# Patient Record
Sex: Male | Born: 1950 | Race: White | Hispanic: No | Marital: Married | Smoking: Never smoker
Health system: Southern US, Community
[De-identification: ages and names within clinical notes are randomized; demographics above are authoritative.]

## PROBLEM LIST (undated history)

## (undated) ENCOUNTER — Emergency Department (HOSPITAL_BASED_OUTPATIENT_CLINIC_OR_DEPARTMENT_OTHER): Admission: EM | Disposition: A | Discharge status: Other Acute Inpatient Hospital | Payer: PPO

## (undated) ENCOUNTER — Emergency Department (HOSPITAL_BASED_OUTPATIENT_CLINIC_OR_DEPARTMENT_OTHER): Admission: EM | Discharge status: Against Medical Advice | Payer: PPO

## (undated) DIAGNOSIS — T8859XA Other complications of anesthesia, initial encounter: Secondary | ICD-10-CM

## (undated) DIAGNOSIS — E789 Disorder of lipoprotein metabolism, unspecified: Secondary | ICD-10-CM

## (undated) DIAGNOSIS — K219 Gastro-esophageal reflux disease without esophagitis: Secondary | ICD-10-CM

## (undated) DIAGNOSIS — I10 Essential (primary) hypertension: Secondary | ICD-10-CM

## (undated) DIAGNOSIS — R7303 Prediabetes: Secondary | ICD-10-CM

## (undated) DIAGNOSIS — N189 Chronic kidney disease, unspecified: Secondary | ICD-10-CM

## (undated) DIAGNOSIS — R519 Headache, unspecified: Secondary | ICD-10-CM

## (undated) HISTORY — PX: OTHER SURGICAL HISTORY: SHX169

## (undated) HISTORY — PX: CARPAL TUNNEL RELEASE: SHX101

## (undated) HISTORY — PX: CERVICAL SPINE SURGERY: SHX589

## (undated) HISTORY — PX: INJECTION KNEE: SHX2446

## (undated) HISTORY — PX: BACK SURGERY: SHX140

---

## 1999-02-17 ENCOUNTER — Ambulatory Visit (HOSPITAL_BASED_OUTPATIENT_CLINIC_OR_DEPARTMENT_OTHER): Admission: RE | Admit: 1999-02-17 | Discharge: 1999-02-17 | Payer: Self-pay | Admitting: Urology

## 2000-08-30 ENCOUNTER — Ambulatory Visit (HOSPITAL_COMMUNITY): Admission: RE | Admit: 2000-08-30 | Discharge: 2000-08-30 | Payer: Self-pay | Admitting: Gastroenterology

## 2000-10-28 ENCOUNTER — Encounter: Admission: RE | Admit: 2000-10-28 | Discharge: 2000-10-28 | Payer: Self-pay | Admitting: Orthopedic Surgery

## 2000-10-29 ENCOUNTER — Encounter: Payer: Self-pay | Admitting: Orthopedic Surgery

## 2000-10-29 ENCOUNTER — Encounter: Admission: RE | Admit: 2000-10-29 | Discharge: 2000-10-29 | Payer: Self-pay | Admitting: Orthopedic Surgery

## 2000-12-12 ENCOUNTER — Ambulatory Visit (HOSPITAL_COMMUNITY): Admission: RE | Admit: 2000-12-12 | Discharge: 2000-12-13 | Payer: Self-pay | Admitting: Neurological Surgery

## 2000-12-12 ENCOUNTER — Encounter: Payer: Self-pay | Admitting: Neurological Surgery

## 2001-04-11 ENCOUNTER — Encounter (INDEPENDENT_AMBULATORY_CARE_PROVIDER_SITE_OTHER): Payer: Self-pay | Admitting: Specialist

## 2001-04-11 ENCOUNTER — Other Ambulatory Visit: Admission: RE | Admit: 2001-04-11 | Discharge: 2001-04-11 | Payer: Self-pay | Admitting: Otolaryngology

## 2005-07-05 ENCOUNTER — Encounter (INDEPENDENT_AMBULATORY_CARE_PROVIDER_SITE_OTHER): Payer: Self-pay | Admitting: *Deleted

## 2005-07-05 ENCOUNTER — Ambulatory Visit (HOSPITAL_BASED_OUTPATIENT_CLINIC_OR_DEPARTMENT_OTHER): Admission: RE | Admit: 2005-07-05 | Discharge: 2005-07-05 | Payer: Self-pay | Admitting: Plastic Surgery

## 2007-06-10 ENCOUNTER — Emergency Department (HOSPITAL_COMMUNITY): Admission: EM | Admit: 2007-06-10 | Discharge: 2007-06-10 | Payer: Self-pay | Admitting: Emergency Medicine

## 2008-11-08 ENCOUNTER — Encounter: Admission: RE | Admit: 2008-11-08 | Discharge: 2008-11-08 | Payer: Self-pay | Admitting: Internal Medicine

## 2009-09-13 ENCOUNTER — Encounter: Admission: RE | Admit: 2009-09-13 | Discharge: 2009-09-13 | Payer: Self-pay | Admitting: Gastroenterology

## 2010-11-10 ENCOUNTER — Encounter
Admission: RE | Admit: 2010-11-10 | Discharge: 2010-11-10 | Payer: Self-pay | Source: Home / Self Care | Attending: Chiropractic Medicine | Admitting: Chiropractic Medicine

## 2011-04-06 NOTE — Op Note (Signed)
[place]  Patient:    Blake Cox, Blake Cox                        MRN: [id number] Proc. Date: 12/12/00 Adm. Date:  [id number] Disc. 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  PROCEDURE:  Right cervical microendoscopic diskectomy with Met-RX system, microscope and microdissection technique.  SURGEON:  Stefani Dama, M.D.  FIRST ASSISTANT:  Cristi Loron, M.D.  ANESTHESIA:  General endotracheal.  INDICATIONS:  The patient is a 60 year old individual who has had weakness and pain in the right shoulder and arm.  He has weakness in the triceps, and an MRI demonstrates a herniated nucleus pulposus at C6-7 in the foramen.  DESCRIPTION OF PROCEDURE:  The patient was brought to the operating room supine on the stretcher.  After smooth induction of general endotracheal anesthesia, a central catheter was placed, and the patient was then placed into the three-pin head rest and into the sitting position.  The back of the neck was shaved, prepped with Duraprep, and draped in a sterile fashion, and then using fluoroscopic localization, a K-wire was passed to the C6-7 interspace.  A 1.5 cm incision was made on either side of the K-wire in a vertical plane paramedian on the right side, and then a series of dilators was placed over the K-wire down to the interspace at C6-7 on the right edge of the facet.  The cannulas were sequentially placed at 18 mm diameter, and an 18 mm x 6 cm long cannula was affixed to the bed frame.  A microscope was brought into the field.  Using monopolar cautery, some of the extraneous tissue around the facet joint was removed.  Then an Anspach and a 2.3 mm drilling bit was used  to remove the inferior margin of the lamina of C6 out to the medial wall of the facet and the superior margin of the lamina of C7.  A series of punches and rongeurs then with microdissection technique was used to remove bits of tissue and expose the common dural tube and the takeoff of the C7 nerve root. Epidural veins above the C7 root were dissected using a microdissection technique and cauterized and then divided.  The nerve root was mobilized from superior to inferior.  There was noted to be a fullness in this region.  Then under the inferior aspect of the nerve, the nerve root was mobilized superiorly and a portion of the ligament was noted to be bowed.  This was incised.  A small fragment of disk was retained from this area.  Further dissection in this area revealed no other fragments of disk.  The nerve root was then checked for its freedom and its clearance at the foramen on the right side at C7.  A good bit of dissection was done during this time, as it was felt that there should be other fragments of disk.  Then by dissecting both cephalad to the nerve and inferiorly to the nerve, it was felt that all the fragments of disk that were available were removed.  The nerve root was well decompressed.  The interspace was controlled hemostatically, and then the microscope was removed from the field and the endoscope was removed, and the subcutaneous tissues were cauterized and then 3-0 Vicryl was used subcutaneously and subcuticularly to close the skin and the fascia.  The patient tolerated the procedure well, was returned to the recovery room in stable condition. DD:  12/12/00 TD:  12/12/00 Job: [id number]

## 2011-04-06 NOTE — Op Note (Signed)
NAMEDURAN, OHERN NO.:  [id number]   MEDICAL RECORD NO.:  [id number]          PATIENT TYPE:  AMB   LOCATION:  DSC                          FACILITY:  [facility]   PHYSICIAN:  Alfredia Ferguson, M.D.  DATE OF BIRTH:  [birth year]   DATE OF PROCEDURE:  07/05/2005  DATE OF DISCHARGE:                                 OPERATIVE REPORT   PREOPERATIVE DIAGNOSIS:  A 1 cm sebaceous cyst, right temple.   POSTOPERATIVE DIAGNOSIS:  A 1 cm sebaceous cyst, right temple.   OPERATION PERFORMED:  Excision sebaceous cyst, right temple.   SURGEON:  Alfredia Ferguson, M.D.   ANESTHESIA:  2% Xylocaine 1:100,000 epinephrine.   INDICATIONS FOR PROCEDURE:  The patient is a 60 year old gentleman with at  least a three-year history of slowly enlarging cyst, right temple area.  He  wishes to have it removed.  He understands the risk of recurrence and  unsightly scarring.  In spite of that, he wishes to proceed.   DESCRIPTION OF PROCEDURE:  An elliptical skin marker was placed over the  cyst which included the diseased punctum.  Local anesthesia was infiltrated  and the area was prepped with Betadine and draped with sterile drapes.  After waiting approximately seven minutes, an elliptical skin incision was  made and the cyst was identified just in the subcutaneous space.  Using a  combination of blunt and sharp dissection, the cyst was dissected out of its  anatomic position.  The cyst was submitted for pathology.  Hemostasis was  accomplished using pressure.  The wound was closed with interrupted 5-0  Monocryl for the dermis followed by a running 6-0 nylon suture for the skin  edges.  The patient tolerated the procedure well.  He had a very short-lived  vasovagal reaction with some sweatiness and feeling of nausea which passed  within a few minutes.  The patient was discharged in good condition.      Alfredia Ferguson, M.D.  Electronically Signed     WBB/MEDQ  D:  07/05/2005  T:   07/05/2005  Job:  [id number]

## 2011-07-04 ENCOUNTER — Other Ambulatory Visit: Payer: Self-pay | Admitting: Gastroenterology

## 2011-07-06 ENCOUNTER — Ambulatory Visit
Admission: RE | Admit: 2011-07-06 | Discharge: 2011-07-06 | Disposition: A | Discharge status: Home / Self Care | Payer: Self-pay | Source: Ambulatory Visit | Attending: Gastroenterology | Admitting: Gastroenterology

## 2016-05-07 DIAGNOSIS — H5201 Hypermetropia, right eye: Secondary | ICD-10-CM | POA: Diagnosis not present

## 2016-05-07 DIAGNOSIS — H25093 Other age-related incipient cataract, bilateral: Secondary | ICD-10-CM | POA: Diagnosis not present

## 2016-05-07 DIAGNOSIS — H524 Presbyopia: Secondary | ICD-10-CM | POA: Diagnosis not present

## 2016-05-07 DIAGNOSIS — H52223 Regular astigmatism, bilateral: Secondary | ICD-10-CM | POA: Diagnosis not present

## 2016-06-25 DIAGNOSIS — L570 Actinic keratosis: Secondary | ICD-10-CM | POA: Diagnosis not present

## 2016-06-25 DIAGNOSIS — D239 Other benign neoplasm of skin, unspecified: Secondary | ICD-10-CM | POA: Diagnosis not present

## 2016-06-27 DIAGNOSIS — R05 Cough: Secondary | ICD-10-CM | POA: Diagnosis not present

## 2016-06-27 DIAGNOSIS — J22 Unspecified acute lower respiratory infection: Secondary | ICD-10-CM | POA: Diagnosis not present

## 2016-07-17 DIAGNOSIS — K210 Gastro-esophageal reflux disease with esophagitis: Secondary | ICD-10-CM | POA: Diagnosis not present

## 2016-07-17 DIAGNOSIS — Z0000 Encounter for general adult medical examination without abnormal findings: Secondary | ICD-10-CM | POA: Diagnosis not present

## 2016-07-17 DIAGNOSIS — E782 Mixed hyperlipidemia: Secondary | ICD-10-CM | POA: Diagnosis not present

## 2016-07-17 DIAGNOSIS — I10 Essential (primary) hypertension: Secondary | ICD-10-CM | POA: Diagnosis not present

## 2016-07-17 DIAGNOSIS — R05 Cough: Secondary | ICD-10-CM | POA: Diagnosis not present

## 2016-07-24 DIAGNOSIS — E782 Mixed hyperlipidemia: Secondary | ICD-10-CM | POA: Diagnosis not present

## 2016-07-24 DIAGNOSIS — K210 Gastro-esophageal reflux disease with esophagitis: Secondary | ICD-10-CM | POA: Diagnosis not present

## 2016-07-24 DIAGNOSIS — M150 Primary generalized (osteo)arthritis: Secondary | ICD-10-CM | POA: Diagnosis not present

## 2016-07-24 DIAGNOSIS — R7303 Prediabetes: Secondary | ICD-10-CM | POA: Diagnosis not present

## 2016-11-26 DIAGNOSIS — M19071 Primary osteoarthritis, right ankle and foot: Secondary | ICD-10-CM | POA: Diagnosis not present

## 2016-11-26 DIAGNOSIS — M79671 Pain in right foot: Secondary | ICD-10-CM | POA: Diagnosis not present

## 2016-12-19 DIAGNOSIS — G8929 Other chronic pain: Secondary | ICD-10-CM | POA: Diagnosis not present

## 2016-12-19 DIAGNOSIS — M71571 Other bursitis, not elsewhere classified, right ankle and foot: Secondary | ICD-10-CM | POA: Diagnosis not present

## 2016-12-19 DIAGNOSIS — M79671 Pain in right foot: Secondary | ICD-10-CM | POA: Diagnosis not present

## 2017-02-12 DIAGNOSIS — Z0000 Encounter for general adult medical examination without abnormal findings: Secondary | ICD-10-CM | POA: Diagnosis not present

## 2017-02-27 DIAGNOSIS — I10 Essential (primary) hypertension: Secondary | ICD-10-CM | POA: Diagnosis not present

## 2017-02-27 DIAGNOSIS — E782 Mixed hyperlipidemia: Secondary | ICD-10-CM | POA: Diagnosis not present

## 2017-03-25 DIAGNOSIS — K602 Anal fissure, unspecified: Secondary | ICD-10-CM | POA: Diagnosis not present

## 2017-03-25 DIAGNOSIS — K219 Gastro-esophageal reflux disease without esophagitis: Secondary | ICD-10-CM | POA: Diagnosis not present

## 2017-04-10 DIAGNOSIS — L570 Actinic keratosis: Secondary | ICD-10-CM | POA: Diagnosis not present

## 2017-05-06 DIAGNOSIS — E782 Mixed hyperlipidemia: Secondary | ICD-10-CM | POA: Diagnosis not present

## 2017-05-06 DIAGNOSIS — I10 Essential (primary) hypertension: Secondary | ICD-10-CM | POA: Diagnosis not present

## 2017-05-13 DIAGNOSIS — H25093 Other age-related incipient cataract, bilateral: Secondary | ICD-10-CM | POA: Diagnosis not present

## 2017-06-04 DIAGNOSIS — I10 Essential (primary) hypertension: Secondary | ICD-10-CM | POA: Diagnosis not present

## 2017-06-04 DIAGNOSIS — L237 Allergic contact dermatitis due to plants, except food: Secondary | ICD-10-CM | POA: Diagnosis not present

## 2017-06-04 DIAGNOSIS — E782 Mixed hyperlipidemia: Secondary | ICD-10-CM | POA: Diagnosis not present

## 2017-07-03 DIAGNOSIS — H903 Sensorineural hearing loss, bilateral: Secondary | ICD-10-CM | POA: Diagnosis not present

## 2017-09-09 DIAGNOSIS — M7022 Olecranon bursitis, left elbow: Secondary | ICD-10-CM | POA: Diagnosis not present

## 2017-09-30 DIAGNOSIS — Z125 Encounter for screening for malignant neoplasm of prostate: Secondary | ICD-10-CM | POA: Diagnosis not present

## 2017-09-30 DIAGNOSIS — M150 Primary generalized (osteo)arthritis: Secondary | ICD-10-CM | POA: Diagnosis not present

## 2017-09-30 DIAGNOSIS — E782 Mixed hyperlipidemia: Secondary | ICD-10-CM | POA: Diagnosis not present

## 2017-10-07 DIAGNOSIS — E782 Mixed hyperlipidemia: Secondary | ICD-10-CM | POA: Diagnosis not present

## 2017-10-07 DIAGNOSIS — Z23 Encounter for immunization: Secondary | ICD-10-CM | POA: Diagnosis not present

## 2017-10-07 DIAGNOSIS — N183 Chronic kidney disease, stage 3 (moderate): Secondary | ICD-10-CM | POA: Diagnosis not present

## 2017-10-07 DIAGNOSIS — I10 Essential (primary) hypertension: Secondary | ICD-10-CM | POA: Diagnosis not present

## 2017-10-07 DIAGNOSIS — M150 Primary generalized (osteo)arthritis: Secondary | ICD-10-CM | POA: Diagnosis not present

## 2017-10-07 DIAGNOSIS — Z0000 Encounter for general adult medical examination without abnormal findings: Secondary | ICD-10-CM | POA: Diagnosis not present

## 2018-01-17 DIAGNOSIS — M5442 Lumbago with sciatica, left side: Secondary | ICD-10-CM | POA: Diagnosis not present

## 2018-01-17 DIAGNOSIS — M5441 Lumbago with sciatica, right side: Secondary | ICD-10-CM | POA: Diagnosis not present

## 2018-01-17 DIAGNOSIS — M62830 Muscle spasm of back: Secondary | ICD-10-CM | POA: Diagnosis not present

## 2018-01-17 DIAGNOSIS — M47816 Spondylosis without myelopathy or radiculopathy, lumbar region: Secondary | ICD-10-CM | POA: Diagnosis not present

## 2018-04-23 DIAGNOSIS — H524 Presbyopia: Secondary | ICD-10-CM | POA: Diagnosis not present

## 2018-04-23 DIAGNOSIS — H5201 Hypermetropia, right eye: Secondary | ICD-10-CM | POA: Diagnosis not present

## 2018-04-23 DIAGNOSIS — H25093 Other age-related incipient cataract, bilateral: Secondary | ICD-10-CM | POA: Diagnosis not present

## 2018-04-23 DIAGNOSIS — H52223 Regular astigmatism, bilateral: Secondary | ICD-10-CM | POA: Diagnosis not present

## 2018-07-14 DIAGNOSIS — K219 Gastro-esophageal reflux disease without esophagitis: Secondary | ICD-10-CM | POA: Diagnosis not present

## 2018-10-03 DIAGNOSIS — M5442 Lumbago with sciatica, left side: Secondary | ICD-10-CM | POA: Diagnosis not present

## 2018-10-03 DIAGNOSIS — M5441 Lumbago with sciatica, right side: Secondary | ICD-10-CM | POA: Diagnosis not present

## 2018-10-03 DIAGNOSIS — G8929 Other chronic pain: Secondary | ICD-10-CM | POA: Diagnosis not present

## 2018-10-03 DIAGNOSIS — M47816 Spondylosis without myelopathy or radiculopathy, lumbar region: Secondary | ICD-10-CM | POA: Diagnosis not present

## 2018-10-07 DIAGNOSIS — M79604 Pain in right leg: Secondary | ICD-10-CM | POA: Diagnosis not present

## 2018-10-07 DIAGNOSIS — M79605 Pain in left leg: Secondary | ICD-10-CM | POA: Diagnosis not present

## 2018-10-07 DIAGNOSIS — R262 Difficulty in walking, not elsewhere classified: Secondary | ICD-10-CM | POA: Diagnosis not present

## 2018-10-07 DIAGNOSIS — M545 Low back pain: Secondary | ICD-10-CM | POA: Diagnosis not present

## 2018-10-08 DIAGNOSIS — E782 Mixed hyperlipidemia: Secondary | ICD-10-CM | POA: Diagnosis not present

## 2018-10-08 DIAGNOSIS — I10 Essential (primary) hypertension: Secondary | ICD-10-CM | POA: Diagnosis not present

## 2018-10-08 DIAGNOSIS — Z0000 Encounter for general adult medical examination without abnormal findings: Secondary | ICD-10-CM | POA: Diagnosis not present

## 2018-10-08 DIAGNOSIS — N183 Chronic kidney disease, stage 3 (moderate): Secondary | ICD-10-CM | POA: Diagnosis not present

## 2018-10-13 DIAGNOSIS — R262 Difficulty in walking, not elsewhere classified: Secondary | ICD-10-CM | POA: Diagnosis not present

## 2018-10-13 DIAGNOSIS — M545 Low back pain: Secondary | ICD-10-CM | POA: Diagnosis not present

## 2018-10-13 DIAGNOSIS — M79604 Pain in right leg: Secondary | ICD-10-CM | POA: Diagnosis not present

## 2018-10-13 DIAGNOSIS — M79605 Pain in left leg: Secondary | ICD-10-CM | POA: Diagnosis not present

## 2018-10-15 DIAGNOSIS — R262 Difficulty in walking, not elsewhere classified: Secondary | ICD-10-CM | POA: Diagnosis not present

## 2018-10-15 DIAGNOSIS — Z0000 Encounter for general adult medical examination without abnormal findings: Secondary | ICD-10-CM | POA: Diagnosis not present

## 2018-10-15 DIAGNOSIS — M79604 Pain in right leg: Secondary | ICD-10-CM | POA: Diagnosis not present

## 2018-10-15 DIAGNOSIS — I10 Essential (primary) hypertension: Secondary | ICD-10-CM | POA: Diagnosis not present

## 2018-10-15 DIAGNOSIS — M79605 Pain in left leg: Secondary | ICD-10-CM | POA: Diagnosis not present

## 2018-10-15 DIAGNOSIS — R002 Palpitations: Secondary | ICD-10-CM | POA: Diagnosis not present

## 2018-10-15 DIAGNOSIS — E782 Mixed hyperlipidemia: Secondary | ICD-10-CM | POA: Diagnosis not present

## 2018-10-15 DIAGNOSIS — N183 Chronic kidney disease, stage 3 (moderate): Secondary | ICD-10-CM | POA: Diagnosis not present

## 2018-10-15 DIAGNOSIS — M545 Low back pain: Secondary | ICD-10-CM | POA: Diagnosis not present

## 2018-10-15 DIAGNOSIS — D751 Secondary polycythemia: Secondary | ICD-10-CM | POA: Diagnosis not present

## 2018-10-21 DIAGNOSIS — M79604 Pain in right leg: Secondary | ICD-10-CM | POA: Diagnosis not present

## 2018-10-21 DIAGNOSIS — M79605 Pain in left leg: Secondary | ICD-10-CM | POA: Diagnosis not present

## 2018-10-21 DIAGNOSIS — M545 Low back pain: Secondary | ICD-10-CM | POA: Diagnosis not present

## 2018-10-21 DIAGNOSIS — R262 Difficulty in walking, not elsewhere classified: Secondary | ICD-10-CM | POA: Diagnosis not present

## 2018-10-23 DIAGNOSIS — M545 Low back pain: Secondary | ICD-10-CM | POA: Diagnosis not present

## 2018-10-23 DIAGNOSIS — R262 Difficulty in walking, not elsewhere classified: Secondary | ICD-10-CM | POA: Diagnosis not present

## 2018-10-23 DIAGNOSIS — M79604 Pain in right leg: Secondary | ICD-10-CM | POA: Diagnosis not present

## 2018-10-23 DIAGNOSIS — M79605 Pain in left leg: Secondary | ICD-10-CM | POA: Diagnosis not present

## 2018-10-29 DIAGNOSIS — R262 Difficulty in walking, not elsewhere classified: Secondary | ICD-10-CM | POA: Diagnosis not present

## 2018-10-29 DIAGNOSIS — M79605 Pain in left leg: Secondary | ICD-10-CM | POA: Diagnosis not present

## 2018-10-29 DIAGNOSIS — M545 Low back pain: Secondary | ICD-10-CM | POA: Diagnosis not present

## 2018-10-29 DIAGNOSIS — M79604 Pain in right leg: Secondary | ICD-10-CM | POA: Diagnosis not present

## 2018-10-31 DIAGNOSIS — R262 Difficulty in walking, not elsewhere classified: Secondary | ICD-10-CM | POA: Diagnosis not present

## 2018-10-31 DIAGNOSIS — M79604 Pain in right leg: Secondary | ICD-10-CM | POA: Diagnosis not present

## 2018-10-31 DIAGNOSIS — M545 Low back pain: Secondary | ICD-10-CM | POA: Diagnosis not present

## 2018-10-31 DIAGNOSIS — M79605 Pain in left leg: Secondary | ICD-10-CM | POA: Diagnosis not present

## 2018-11-05 DIAGNOSIS — R262 Difficulty in walking, not elsewhere classified: Secondary | ICD-10-CM | POA: Diagnosis not present

## 2018-11-05 DIAGNOSIS — M545 Low back pain: Secondary | ICD-10-CM | POA: Diagnosis not present

## 2018-11-05 DIAGNOSIS — M79605 Pain in left leg: Secondary | ICD-10-CM | POA: Diagnosis not present

## 2018-11-05 DIAGNOSIS — M79604 Pain in right leg: Secondary | ICD-10-CM | POA: Diagnosis not present

## 2018-11-13 DIAGNOSIS — M545 Low back pain: Secondary | ICD-10-CM | POA: Diagnosis not present

## 2018-11-13 DIAGNOSIS — M79604 Pain in right leg: Secondary | ICD-10-CM | POA: Diagnosis not present

## 2018-11-13 DIAGNOSIS — R262 Difficulty in walking, not elsewhere classified: Secondary | ICD-10-CM | POA: Diagnosis not present

## 2018-11-13 DIAGNOSIS — M79605 Pain in left leg: Secondary | ICD-10-CM | POA: Diagnosis not present

## 2018-11-14 DIAGNOSIS — G8929 Other chronic pain: Secondary | ICD-10-CM | POA: Diagnosis not present

## 2018-11-14 DIAGNOSIS — M5441 Lumbago with sciatica, right side: Secondary | ICD-10-CM | POA: Diagnosis not present

## 2018-11-14 DIAGNOSIS — M5442 Lumbago with sciatica, left side: Secondary | ICD-10-CM | POA: Diagnosis not present

## 2018-11-18 DIAGNOSIS — L570 Actinic keratosis: Secondary | ICD-10-CM | POA: Diagnosis not present

## 2018-11-18 DIAGNOSIS — D229 Melanocytic nevi, unspecified: Secondary | ICD-10-CM | POA: Diagnosis not present

## 2018-11-18 DIAGNOSIS — L2081 Atopic neurodermatitis: Secondary | ICD-10-CM | POA: Diagnosis not present

## 2018-11-18 DIAGNOSIS — L821 Other seborrheic keratosis: Secondary | ICD-10-CM | POA: Diagnosis not present

## 2018-11-27 ENCOUNTER — Other Ambulatory Visit: Payer: Self-pay | Admitting: Internal Medicine

## 2018-11-27 DIAGNOSIS — M5442 Lumbago with sciatica, left side: Secondary | ICD-10-CM

## 2018-11-27 DIAGNOSIS — M5441 Lumbago with sciatica, right side: Secondary | ICD-10-CM

## 2018-12-06 ENCOUNTER — Ambulatory Visit
Admission: RE | Admit: 2018-12-06 | Discharge: 2018-12-06 | Disposition: A | Discharge status: Home / Self Care | Payer: PPO | Source: Ambulatory Visit | Attending: Internal Medicine | Admitting: Internal Medicine

## 2018-12-06 DIAGNOSIS — M48061 Spinal stenosis, lumbar region without neurogenic claudication: Secondary | ICD-10-CM | POA: Diagnosis not present

## 2018-12-06 DIAGNOSIS — M5442 Lumbago with sciatica, left side: Secondary | ICD-10-CM

## 2018-12-06 DIAGNOSIS — M5441 Lumbago with sciatica, right side: Secondary | ICD-10-CM

## 2018-12-31 DIAGNOSIS — M4150 Other secondary scoliosis, site unspecified: Secondary | ICD-10-CM | POA: Diagnosis not present

## 2018-12-31 DIAGNOSIS — Z6828 Body mass index (BMI) 28.0-28.9, adult: Secondary | ICD-10-CM | POA: Diagnosis not present

## 2018-12-31 DIAGNOSIS — M48061 Spinal stenosis, lumbar region without neurogenic claudication: Secondary | ICD-10-CM | POA: Diagnosis not present

## 2018-12-31 DIAGNOSIS — I10 Essential (primary) hypertension: Secondary | ICD-10-CM | POA: Diagnosis not present

## 2019-01-27 DIAGNOSIS — M5116 Intervertebral disc disorders with radiculopathy, lumbar region: Secondary | ICD-10-CM | POA: Diagnosis not present

## 2019-01-27 DIAGNOSIS — M5416 Radiculopathy, lumbar region: Secondary | ICD-10-CM | POA: Diagnosis not present

## 2019-05-04 DIAGNOSIS — M5416 Radiculopathy, lumbar region: Secondary | ICD-10-CM | POA: Diagnosis not present

## 2019-05-04 DIAGNOSIS — M5116 Intervertebral disc disorders with radiculopathy, lumbar region: Secondary | ICD-10-CM | POA: Diagnosis not present

## 2019-05-08 ENCOUNTER — Other Ambulatory Visit (HOSPITAL_COMMUNITY): Payer: Self-pay | Admitting: Internal Medicine

## 2019-05-08 DIAGNOSIS — R7303 Prediabetes: Secondary | ICD-10-CM | POA: Diagnosis not present

## 2019-05-08 DIAGNOSIS — R29810 Facial weakness: Secondary | ICD-10-CM | POA: Diagnosis not present

## 2019-05-08 DIAGNOSIS — R531 Weakness: Secondary | ICD-10-CM | POA: Diagnosis not present

## 2019-05-08 DIAGNOSIS — E782 Mixed hyperlipidemia: Secondary | ICD-10-CM | POA: Diagnosis not present

## 2019-05-08 DIAGNOSIS — R2990 Unspecified symptoms and signs involving the nervous system: Secondary | ICD-10-CM

## 2019-05-08 DIAGNOSIS — N183 Chronic kidney disease, stage 3 (moderate): Secondary | ICD-10-CM | POA: Diagnosis not present

## 2019-05-08 DIAGNOSIS — R278 Other lack of coordination: Secondary | ICD-10-CM | POA: Diagnosis not present

## 2019-05-08 DIAGNOSIS — I10 Essential (primary) hypertension: Secondary | ICD-10-CM | POA: Diagnosis not present

## 2019-05-12 ENCOUNTER — Telehealth (HOSPITAL_COMMUNITY): Payer: Self-pay | Admitting: Radiology

## 2019-05-12 NOTE — Telephone Encounter (Signed)

## 2019-05-13 ENCOUNTER — Ambulatory Visit (HOSPITAL_COMMUNITY): Payer: PPO | Attending: Cardiology

## 2019-05-13 ENCOUNTER — Encounter (INDEPENDENT_AMBULATORY_CARE_PROVIDER_SITE_OTHER): Payer: Self-pay

## 2019-05-13 ENCOUNTER — Other Ambulatory Visit: Payer: Self-pay

## 2019-05-13 DIAGNOSIS — I77810 Thoracic aortic ectasia: Secondary | ICD-10-CM | POA: Insufficient documentation

## 2019-05-13 DIAGNOSIS — I358 Other nonrheumatic aortic valve disorders: Secondary | ICD-10-CM | POA: Diagnosis not present

## 2019-05-13 DIAGNOSIS — R2990 Unspecified symptoms and signs involving the nervous system: Secondary | ICD-10-CM

## 2019-05-21 DIAGNOSIS — N183 Chronic kidney disease, stage 3 (moderate): Secondary | ICD-10-CM | POA: Diagnosis not present

## 2019-05-21 DIAGNOSIS — D751 Secondary polycythemia: Secondary | ICD-10-CM | POA: Diagnosis not present

## 2019-05-21 DIAGNOSIS — E782 Mixed hyperlipidemia: Secondary | ICD-10-CM | POA: Diagnosis not present

## 2019-05-29 DIAGNOSIS — R278 Other lack of coordination: Secondary | ICD-10-CM | POA: Diagnosis not present

## 2019-05-29 DIAGNOSIS — Z7189 Other specified counseling: Secondary | ICD-10-CM | POA: Diagnosis not present

## 2019-05-29 DIAGNOSIS — E782 Mixed hyperlipidemia: Secondary | ICD-10-CM | POA: Diagnosis not present

## 2019-05-29 DIAGNOSIS — R42 Dizziness and giddiness: Secondary | ICD-10-CM | POA: Diagnosis not present

## 2019-05-29 DIAGNOSIS — I10 Essential (primary) hypertension: Secondary | ICD-10-CM | POA: Diagnosis not present

## 2019-06-03 DIAGNOSIS — R42 Dizziness and giddiness: Secondary | ICD-10-CM | POA: Diagnosis not present

## 2019-06-19 ENCOUNTER — Other Ambulatory Visit: Payer: Self-pay

## 2019-06-19 DIAGNOSIS — S0341XA Sprain of jaw, right side, initial encounter: Secondary | ICD-10-CM | POA: Diagnosis not present

## 2019-07-07 DIAGNOSIS — H52223 Regular astigmatism, bilateral: Secondary | ICD-10-CM | POA: Diagnosis not present

## 2019-07-07 DIAGNOSIS — H5203 Hypermetropia, bilateral: Secondary | ICD-10-CM | POA: Diagnosis not present

## 2019-07-07 DIAGNOSIS — H25093 Other age-related incipient cataract, bilateral: Secondary | ICD-10-CM | POA: Diagnosis not present

## 2019-07-07 DIAGNOSIS — H524 Presbyopia: Secondary | ICD-10-CM | POA: Diagnosis not present

## 2019-08-06 DIAGNOSIS — M5116 Intervertebral disc disorders with radiculopathy, lumbar region: Secondary | ICD-10-CM | POA: Diagnosis not present

## 2019-08-06 DIAGNOSIS — M5416 Radiculopathy, lumbar region: Secondary | ICD-10-CM | POA: Diagnosis not present

## 2019-08-17 DIAGNOSIS — K219 Gastro-esophageal reflux disease without esophagitis: Secondary | ICD-10-CM | POA: Diagnosis not present

## 2019-09-07 DIAGNOSIS — E782 Mixed hyperlipidemia: Secondary | ICD-10-CM | POA: Diagnosis not present

## 2019-09-11 DIAGNOSIS — Z7189 Other specified counseling: Secondary | ICD-10-CM | POA: Diagnosis not present

## 2019-09-11 DIAGNOSIS — E782 Mixed hyperlipidemia: Secondary | ICD-10-CM | POA: Diagnosis not present

## 2019-09-11 DIAGNOSIS — I10 Essential (primary) hypertension: Secondary | ICD-10-CM | POA: Diagnosis not present

## 2019-09-11 DIAGNOSIS — Z23 Encounter for immunization: Secondary | ICD-10-CM | POA: Diagnosis not present

## 2019-09-11 DIAGNOSIS — F419 Anxiety disorder, unspecified: Secondary | ICD-10-CM | POA: Diagnosis not present

## 2019-10-27 DIAGNOSIS — M79672 Pain in left foot: Secondary | ICD-10-CM | POA: Diagnosis not present

## 2019-10-27 DIAGNOSIS — M7732 Calcaneal spur, left foot: Secondary | ICD-10-CM | POA: Diagnosis not present

## 2019-10-31 ENCOUNTER — Other Ambulatory Visit: Payer: Self-pay | Admitting: *Deleted

## 2019-10-31 ENCOUNTER — Other Ambulatory Visit: Payer: Self-pay

## 2019-10-31 ENCOUNTER — Ambulatory Visit: Payer: PPO

## 2019-10-31 ENCOUNTER — Ambulatory Visit (INDEPENDENT_AMBULATORY_CARE_PROVIDER_SITE_OTHER): Payer: PPO | Admitting: Podiatry

## 2019-10-31 VITALS — BP 143/89 | HR 71 | Temp 97.5°F | Resp 16

## 2019-10-31 DIAGNOSIS — M722 Plantar fascial fibromatosis: Secondary | ICD-10-CM

## 2019-10-31 MED ORDER — MELOXICAM 15 MG PO TABS: 15.0000 mg | ORAL_TABLET | Freq: Every day | ORAL | 1 refills | Status: DC

## 2019-10-31 MED ORDER — METHYLPREDNISOLONE 4 MG PO TBPK: ORAL_TABLET | ORAL | 0 refills | Status: DC

## 2019-11-03 DIAGNOSIS — Z0000 Encounter for general adult medical examination without abnormal findings: Secondary | ICD-10-CM | POA: Diagnosis not present

## 2019-11-03 DIAGNOSIS — E782 Mixed hyperlipidemia: Secondary | ICD-10-CM | POA: Diagnosis not present

## 2019-11-03 DIAGNOSIS — I10 Essential (primary) hypertension: Secondary | ICD-10-CM | POA: Diagnosis not present

## 2019-11-04 NOTE — Progress Notes (Signed)
   Subjective: 68 y.o. male presenting today with a chief complaint throbbing, burning and aching pain of the left heel that began two months ago. He states it feels like he is walking on a stone or bruise. Being on the foot increases the pain throughout the day. He has been taking Ibuprofen with little relief. Patient is here for further evaluation and treatment.   No past medical history on file.   Objective: Physical Exam General: The patient is alert and oriented x3 in no acute distress.  Dermatology: Skin is warm, dry and supple bilateral lower extremities. Negative for open lesions or macerations bilateral.   Vascular: Dorsalis Pedis and Posterior Tibial pulses palpable bilateral.  Capillary fill time is immediate to all digits.  Neurological: Epicritic and protective threshold intact bilateral.   Musculoskeletal: Tenderness to palpation to the plantar aspect of the left heel along the plantar fascia. All other joints range of motion within normal limits bilateral. Strength 5/5 in all groups bilateral.   Assessment: 1. Plantar fasciitis left foot  Plan of Care:  1. Patient evaluated. Previous Xrays from [facility] reviewed.   2. Injection of 0.5cc Celestone soluspan injected into the left plantar fascia.  3. Rx for Medrol Dose Pak placed 4. Rx for Meloxicam ordered for patient. 5. Plantar fascial band(s) dispensed  6. Instructed patient regarding therapies and modalities at home to alleviate symptoms.  7. Return to clinic in 4 weeks.     Edrick Kins, DPM [facility]  Dr. Edrick Kins, DPM    [address], [place] [address]                Office [phone]  Fax [phone]

## 2019-11-06 DIAGNOSIS — I10 Essential (primary) hypertension: Secondary | ICD-10-CM | POA: Diagnosis not present

## 2019-11-06 DIAGNOSIS — R7303 Prediabetes: Secondary | ICD-10-CM | POA: Diagnosis not present

## 2019-11-06 DIAGNOSIS — E782 Mixed hyperlipidemia: Secondary | ICD-10-CM | POA: Diagnosis not present

## 2019-11-06 DIAGNOSIS — Z0000 Encounter for general adult medical examination without abnormal findings: Secondary | ICD-10-CM | POA: Diagnosis not present

## 2019-11-06 DIAGNOSIS — F419 Anxiety disorder, unspecified: Secondary | ICD-10-CM | POA: Diagnosis not present

## 2019-11-06 DIAGNOSIS — M150 Primary generalized (osteo)arthritis: Secondary | ICD-10-CM | POA: Diagnosis not present

## 2019-11-06 DIAGNOSIS — Z23 Encounter for immunization: Secondary | ICD-10-CM | POA: Diagnosis not present

## 2019-11-30 ENCOUNTER — Ambulatory Visit: Payer: PPO | Admitting: Podiatry

## 2020-02-06 IMAGING — MR MR LUMBAR SPINE W/O CM
4 of 5 series · 24 of 48 positions shown · non-contrast
Comparison: Prior radiograph from 10/03/2018 as well as previous
MRI from 11/10/2010.

CLINICAL DATA: Initial evaluation for low back pain extending into
the bilateral hips and buttocks.

EXAM:
MRI LUMBAR SPINE WITHOUT CONTRAST
TECHNIQUE: Multiplanar, multisequence MR imaging of the lumbar spine was
performed. No intravenous contrast was administered.

[Series 4: T1 · sagittal · 4.0mm · 0.55mm/px · 5 of 13 slices shown (1 of 2)]
[im 1/13]
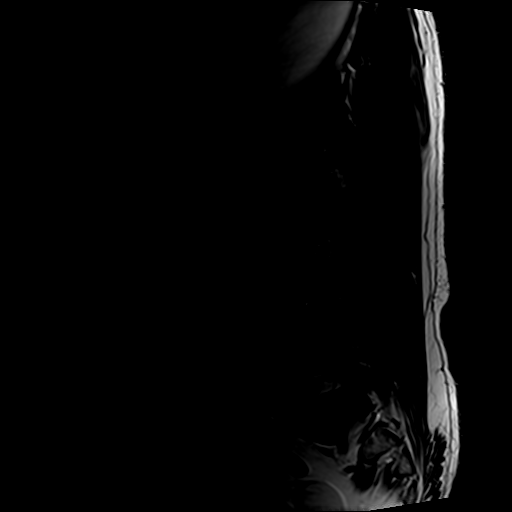
[im 4/13]
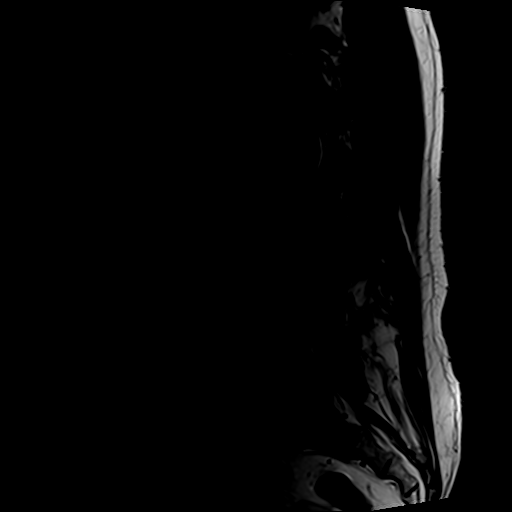
[im 7/13]
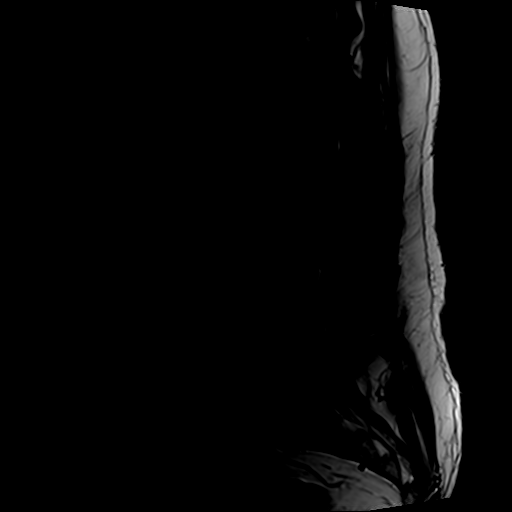
[im 10/13]
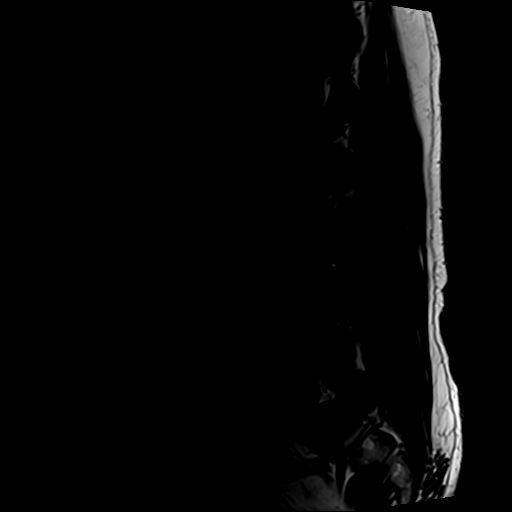
[im 13/13]
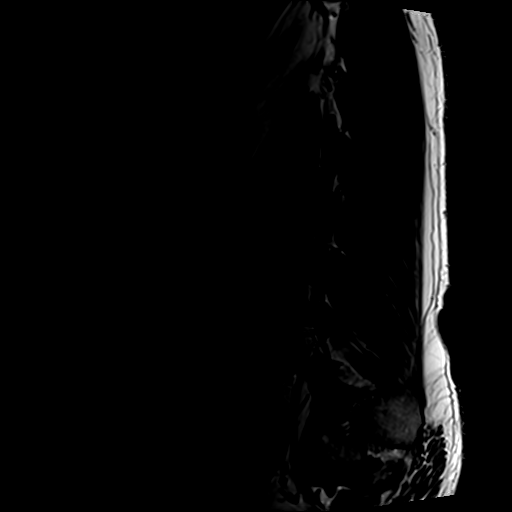

[Series 5: T2 · sagittal · 4.0mm · 0.55mm/px · 5 of 13 slices shown (1 of 2)]
[im 1/13]
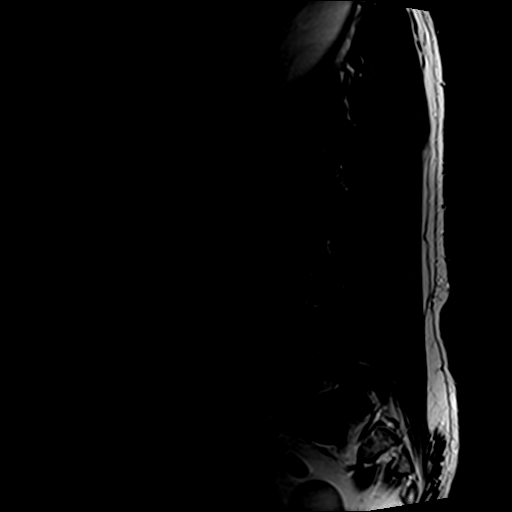
[im 4/13]
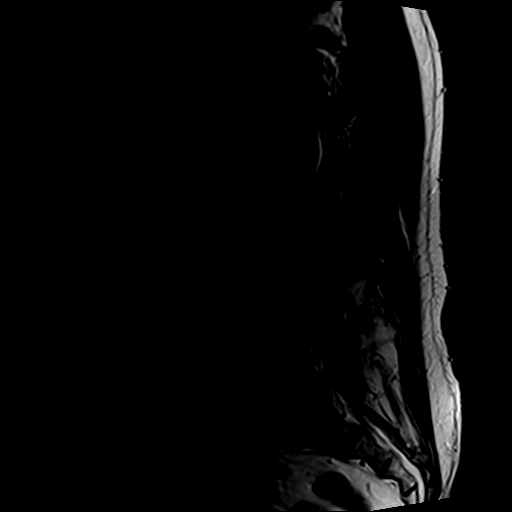
[im 7/13]
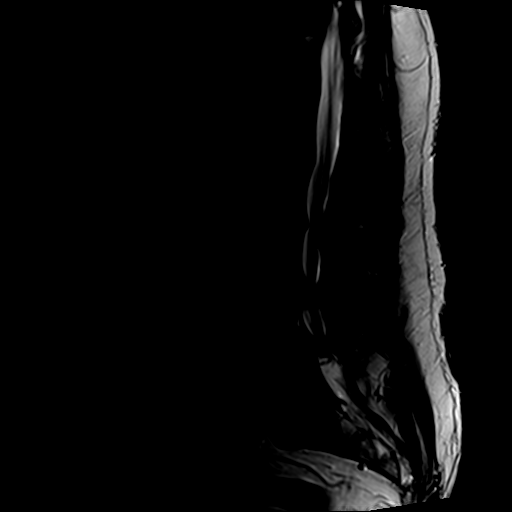
[im 10/13]
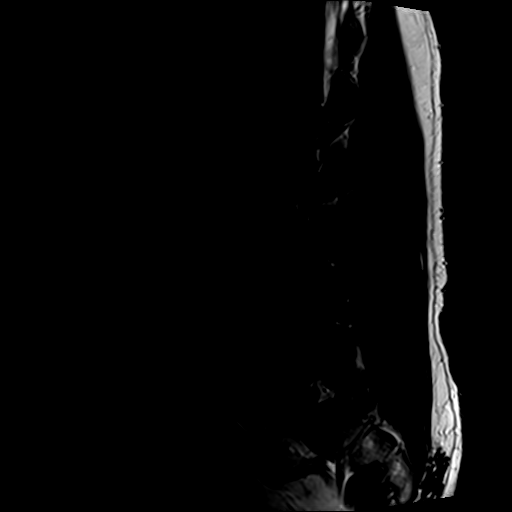
[im 13/13]
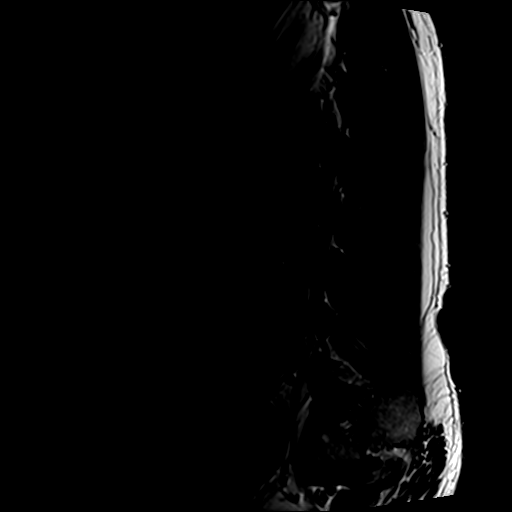

[Series 6: T2 · axial · 4.0mm · 0.74mm/px · z∈[-143,+65]mm · 10 of 39 slices shown (2 of 2)]
[im 3/39]
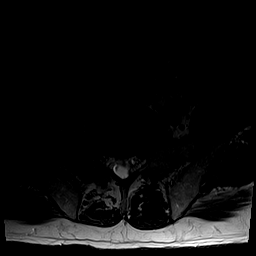
[im 6/39]
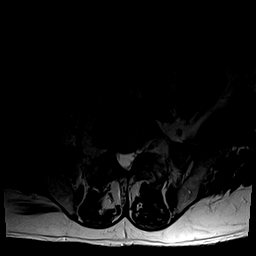
[im 8/39]
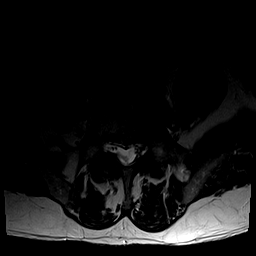
[im 13/39]
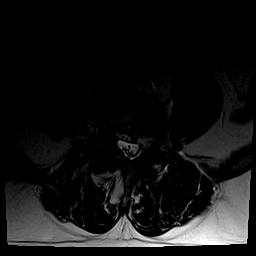
[im 18/39]
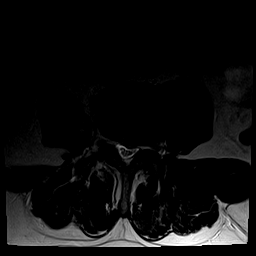
[im 21/39]
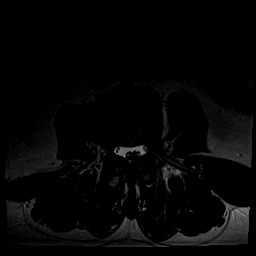
[im 23/39]
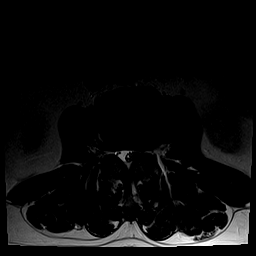
[im 28/39]
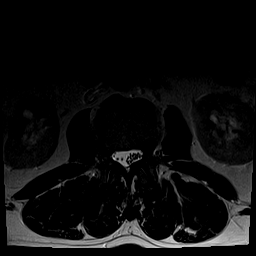
[im 33/39]
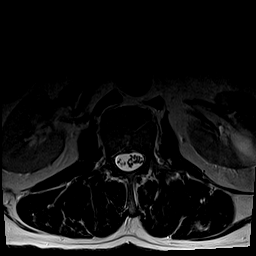
[im 39/39]
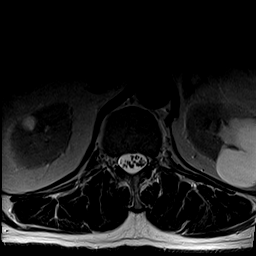

[Series 7: T1 · axial · 4.0mm · 0.37mm/px · z∈[-143,+34]mm · 4 of 39 slices shown (2 of 2)]
[im 3/39]
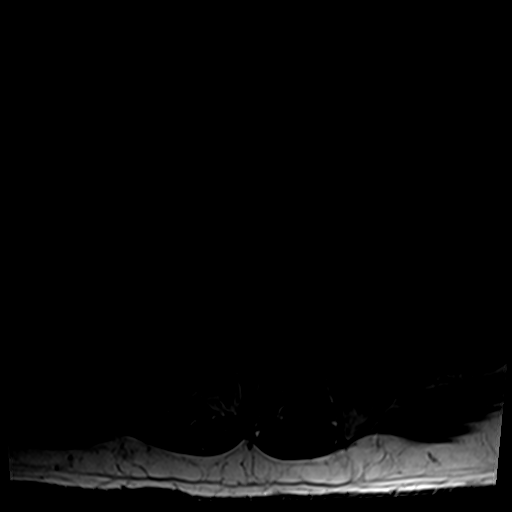
[im 6/39]
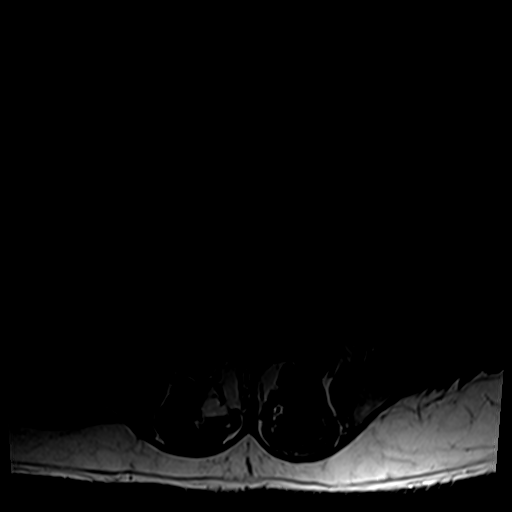
[im 21/39]
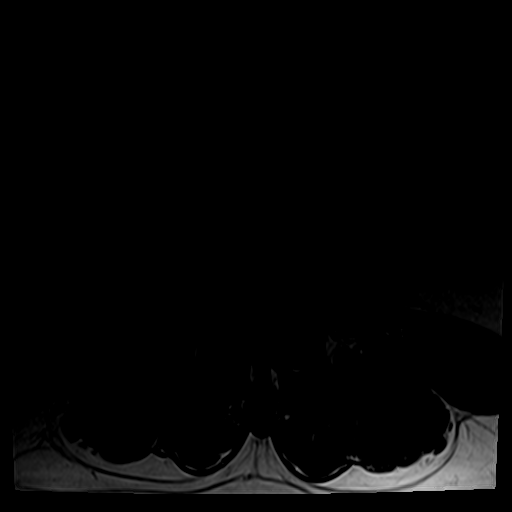
[im 33/39]
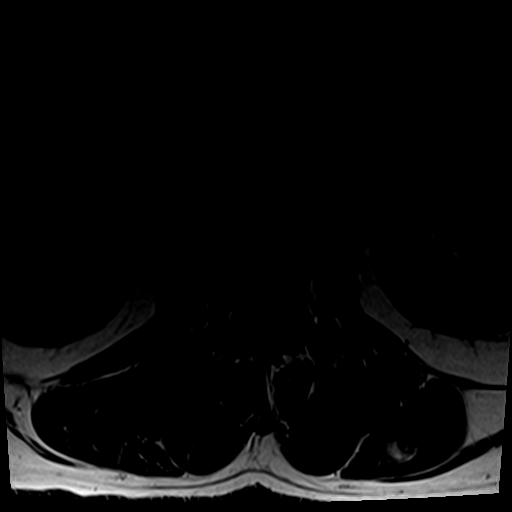

[24 of 48 positions shown; findings below may reference images not displayed]

FINDINGS: Segmentation: Standard. Lowest well-formed disc labeled the L5-S1
level.

Alignment: Levoscoliosis. 4 mm anterolisthesis of L4 on L5, with 2-3
mm retrolisthesis of L3 on L4.

Vertebrae: Vertebral body heights maintained without evidence for
acute or chronic fracture. Bone marrow signal intensity within
normal limits. Approximate 1 cm benign hemangioma noted within the
L2 vertebral body. No other discrete or worrisome osseous lesions.
Discogenic reactive endplate changes present about the right aspect
of the L4-5 interspace. No other abnormal marrow edema.

Conus medullaris and cauda equina: Conus extends to the T12 level.
Conus medullaris within normal limits. Mild clumping of the nerve
roots of the cauda equina related to stenosis.

Paraspinal and other soft tissues: Paraspinous soft tissues
demonstrate no acute finding. Scattered T2 hyperintense renal cysts
noted bilaterally, greater on the left. Visualized visceral
structures otherwise unremarkable.

Disc levels:

L1-2: Mild annular disc bulge, slightly asymmetric to the left. Mild
facet and ligament flavum hypertrophy. Resultant mild left lateral
recess stenosis without significant canal narrowing. Foramina remain
patent.

L2-3: Diffuse disc bulge with disc desiccation. Disc bulging
asymmetric to the left. Moderate facet and ligament flavum
hypertrophy. Resultant moderate canal with severe left lateral
recess stenosis. Mild bilateral L2 foraminal narrowing.

L3-4: Diffuse disc bulge with disc desiccation. Superimposed small
central disc protrusion indents the ventral thecal sac. Associated
annular fissure. Moderate facet and ligament flavum hypertrophy.
Resultant severe canal with bilateral subarticular stenosis.
Moderate right with mild left L3 foraminal narrowing.

L4-5: Anterolisthesis. Diffuse disc bulge with disc desiccation.
Disc bulging asymmetric to the right. Severe facet and ligament
flavum hypertrophy. Superimposed 5 mm synovial cyst at the
anteromedial aspect of the left L4-5 facet (series 6, image 29).
Resultant severe canal with bilateral lateral recess stenosis.
Moderate right L4 foraminal narrowing.

L5-S1: Negative interspace. Mild to moderate facet hypertrophy.
Resultant mild bilateral lateral recess narrowing without
significant spinal stenosis. Foramina remain patent.
IMPRESSION: 1. Multifactorial degenerative changes at L3-4 and L4-5 with
resultant severe canal and bilateral subarticular stenosis.
Associated moderate right L3 and L4 foraminal narrowing at these
levels as well.
2. Disc bulge with facet hypertrophy at L2-3 with resultant severe
left lateral recess stenosis.
3. Mild disc bulge and facet hypertrophy at L1-2 with resultant mild
left lateral recess narrowing.

## 2020-02-08 DIAGNOSIS — M5116 Intervertebral disc disorders with radiculopathy, lumbar region: Secondary | ICD-10-CM | POA: Diagnosis not present

## 2020-02-08 DIAGNOSIS — M5416 Radiculopathy, lumbar region: Secondary | ICD-10-CM | POA: Diagnosis not present

## 2020-03-29 DIAGNOSIS — E782 Mixed hyperlipidemia: Secondary | ICD-10-CM | POA: Diagnosis not present

## 2020-03-29 DIAGNOSIS — I10 Essential (primary) hypertension: Secondary | ICD-10-CM | POA: Diagnosis not present

## 2020-03-29 DIAGNOSIS — R7303 Prediabetes: Secondary | ICD-10-CM | POA: Diagnosis not present

## 2020-04-01 DIAGNOSIS — I10 Essential (primary) hypertension: Secondary | ICD-10-CM | POA: Diagnosis not present

## 2020-04-01 DIAGNOSIS — R7303 Prediabetes: Secondary | ICD-10-CM | POA: Diagnosis not present

## 2020-04-01 DIAGNOSIS — D229 Melanocytic nevi, unspecified: Secondary | ICD-10-CM | POA: Diagnosis not present

## 2020-04-01 DIAGNOSIS — E782 Mixed hyperlipidemia: Secondary | ICD-10-CM | POA: Diagnosis not present

## 2020-04-18 DIAGNOSIS — Z20822 Contact with and (suspected) exposure to covid-19: Secondary | ICD-10-CM | POA: Diagnosis not present

## 2020-04-18 DIAGNOSIS — Z6825 Body mass index (BMI) 25.0-25.9, adult: Secondary | ICD-10-CM | POA: Diagnosis not present

## 2020-05-31 DIAGNOSIS — M5116 Intervertebral disc disorders with radiculopathy, lumbar region: Secondary | ICD-10-CM | POA: Diagnosis not present

## 2020-05-31 DIAGNOSIS — M5416 Radiculopathy, lumbar region: Secondary | ICD-10-CM | POA: Diagnosis not present

## 2020-06-20 DIAGNOSIS — M25511 Pain in right shoulder: Secondary | ICD-10-CM | POA: Diagnosis not present

## 2020-06-20 DIAGNOSIS — G43909 Migraine, unspecified, not intractable, without status migrainosus: Secondary | ICD-10-CM | POA: Diagnosis not present

## 2020-06-20 DIAGNOSIS — M19012 Primary osteoarthritis, left shoulder: Secondary | ICD-10-CM | POA: Diagnosis not present

## 2020-06-20 DIAGNOSIS — G4762 Sleep related leg cramps: Secondary | ICD-10-CM | POA: Diagnosis not present

## 2020-06-21 DIAGNOSIS — M7581 Other shoulder lesions, right shoulder: Secondary | ICD-10-CM | POA: Diagnosis not present

## 2020-06-21 DIAGNOSIS — N189 Chronic kidney disease, unspecified: Secondary | ICD-10-CM | POA: Diagnosis not present

## 2020-06-21 DIAGNOSIS — I10 Essential (primary) hypertension: Secondary | ICD-10-CM | POA: Diagnosis not present

## 2020-06-21 DIAGNOSIS — M25511 Pain in right shoulder: Secondary | ICD-10-CM | POA: Diagnosis not present

## 2020-06-21 DIAGNOSIS — M129 Arthropathy, unspecified: Secondary | ICD-10-CM | POA: Diagnosis not present

## 2020-06-21 DIAGNOSIS — M19019 Primary osteoarthritis, unspecified shoulder: Secondary | ICD-10-CM | POA: Diagnosis not present

## 2020-06-21 DIAGNOSIS — M25512 Pain in left shoulder: Secondary | ICD-10-CM | POA: Diagnosis not present

## 2020-07-11 DIAGNOSIS — M7581 Other shoulder lesions, right shoulder: Secondary | ICD-10-CM | POA: Diagnosis not present

## 2020-07-11 DIAGNOSIS — M25511 Pain in right shoulder: Secondary | ICD-10-CM | POA: Diagnosis not present

## 2020-08-19 DIAGNOSIS — U071 COVID-19: Secondary | ICD-10-CM | POA: Diagnosis not present

## 2020-08-19 DIAGNOSIS — B342 Coronavirus infection, unspecified: Secondary | ICD-10-CM | POA: Diagnosis not present

## 2020-08-21 ENCOUNTER — Other Ambulatory Visit: Payer: Self-pay | Admitting: Physician Assistant

## 2020-08-21 DIAGNOSIS — I10 Essential (primary) hypertension: Secondary | ICD-10-CM

## 2020-08-21 DIAGNOSIS — U071 COVID-19: Secondary | ICD-10-CM

## 2020-08-21 NOTE — Progress Notes (Signed)
I connected by phone with Blake Cox on 08/21/2020 at 1:55 PM to discuss the potential use of a new treatment for mild to moderate COVID-19 viral infection in non-hospitalized patients.  This patient is a 69 y.o. male that meets the FDA criteria for Emergency Use Authorization of COVID monoclonal antibody casirivimab/imdevimab or bamlanivimab/eteseviamb.  Has a (+) direct SARS-CoV-2 viral test result  Has mild or moderate COVID-19   Is NOT hospitalized due to COVID-19  Is within 10 days of symptom onset  Has at least one of the high risk factor(s) for progression to severe COVID-19 and/or hospitalization as defined in EUA.  Specific high risk criteria : Older age (>/= 69 yo), BMI > 25 and Cardiovascular disease or hypertension   I have spoken and communicated the following to the patient or parent/caregiver regarding COVID monoclonal antibody treatment:  1. FDA has authorized the emergency use for the treatment of mild to moderate COVID-19 in adults and pediatric patients with positive results of direct SARS-CoV-2 viral testing who are 34 years of age and older weighing at least 40 kg, and who are at high risk for progressing to severe COVID-19 and/or hospitalization.  2. The significant known and potential risks and benefits of COVID monoclonal antibody, and the extent to which such potential risks and benefits are unknown.  3. Information on available alternative treatments and the risks and benefits of those alternatives, including clinical trials.  4. Patients treated with COVID monoclonal antibody should continue to self-isolate and use infection control measures (e.g., wear mask, isolate, social distance, avoid sharing personal items, clean and disinfect "high touch" surfaces, and frequent handwashing) according to CDC guidelines.   5. The patient or parent/caregiver has the option to accept or refuse COVID monoclonal antibody treatment.  After reviewing this information with the  patient, the patient has agreed to receive one of the available covid 19 monoclonal antibodies and will be provided an appropriate fact sheet prior to infusion. [name], [place] 08/21/2020 1:55 PM

## 2020-08-22 ENCOUNTER — Other Ambulatory Visit (HOSPITAL_COMMUNITY): Payer: Self-pay

## 2020-08-22 ENCOUNTER — Ambulatory Visit (HOSPITAL_COMMUNITY)
Admission: RE | Admit: 2020-08-22 | Discharge: 2020-08-22 | Disposition: A | Discharge status: Home / Self Care | Payer: Medicare Other | Source: Ambulatory Visit | Attending: Pulmonary Disease | Admitting: Pulmonary Disease

## 2020-08-22 DIAGNOSIS — I10 Essential (primary) hypertension: Secondary | ICD-10-CM | POA: Insufficient documentation

## 2020-08-22 DIAGNOSIS — Z23 Encounter for immunization: Secondary | ICD-10-CM | POA: Diagnosis not present

## 2020-08-22 DIAGNOSIS — U071 COVID-19: Secondary | ICD-10-CM | POA: Diagnosis present

## 2020-08-22 MED ORDER — ALBUTEROL SULFATE HFA 108 (90 BASE) MCG/ACT IN AERS: 2.0000 | INHALATION_SPRAY | Freq: Once | RESPIRATORY_TRACT | Status: DC | PRN

## 2020-08-22 MED ORDER — EPINEPHRINE 0.3 MG/0.3ML IJ SOAJ: 0.3000 mg | Freq: Once | INTRAMUSCULAR | Status: DC | PRN

## 2020-08-22 MED ORDER — CASIRIVIMAB-IMDEVIMAB 600-600 MG/10ML IJ SOLN
1200.0000 mg | Freq: Once | INTRAMUSCULAR | Status: AC
  Administered 2020-08-22: 1200 mg via INTRAVENOUS

## 2020-08-22 MED ORDER — DIPHENHYDRAMINE HCL 50 MG/ML IJ SOLN: 50.0000 mg | Freq: Once | INTRAMUSCULAR | Status: DC | PRN

## 2020-08-22 MED ORDER — METHYLPREDNISOLONE SODIUM SUCC 125 MG IJ SOLR: 125.0000 mg | Freq: Once | INTRAMUSCULAR | Status: DC | PRN

## 2020-08-22 MED ORDER — FAMOTIDINE IN NACL 20-0.9 MG/50ML-% IV SOLN: 20.0000 mg | Freq: Once | INTRAVENOUS | Status: DC | PRN

## 2020-08-22 MED ORDER — SODIUM CHLORIDE 0.9 % IV SOLN: INTRAVENOUS | Status: DC | PRN

## 2020-08-22 NOTE — Progress Notes (Signed)
  Diagnosis: COVID-19  Physician: Dr. Asencion Noble Procedure: Covid Infusion Clinic Med: casirivimab\imdevimab infusion - Provided patient with casirivimab\imdevimab fact sheet for patients, parents and caregivers prior to infusion.  Complications: No immediate complications noted.  Discharge: Discharged home   Dal Blew, Cathlyn Parsons 08/22/2020

## 2020-08-22 NOTE — Discharge Instructions (Signed)

## 2020-09-01 DIAGNOSIS — M48061 Spinal stenosis, lumbar region without neurogenic claudication: Secondary | ICD-10-CM | POA: Diagnosis not present

## 2020-09-01 DIAGNOSIS — Z6826 Body mass index (BMI) 26.0-26.9, adult: Secondary | ICD-10-CM | POA: Diagnosis not present

## 2020-09-01 DIAGNOSIS — I10 Essential (primary) hypertension: Secondary | ICD-10-CM | POA: Diagnosis not present

## 2020-09-08 DIAGNOSIS — M5416 Radiculopathy, lumbar region: Secondary | ICD-10-CM | POA: Diagnosis not present

## 2020-09-08 DIAGNOSIS — M5116 Intervertebral disc disorders with radiculopathy, lumbar region: Secondary | ICD-10-CM | POA: Diagnosis not present

## 2020-11-09 DIAGNOSIS — K219 Gastro-esophageal reflux disease without esophagitis: Secondary | ICD-10-CM | POA: Diagnosis not present

## 2020-11-09 DIAGNOSIS — Z86010 Personal history of colonic polyps: Secondary | ICD-10-CM | POA: Diagnosis not present

## 2020-12-22 DIAGNOSIS — Z0000 Encounter for general adult medical examination without abnormal findings: Secondary | ICD-10-CM | POA: Diagnosis not present

## 2020-12-22 DIAGNOSIS — I10 Essential (primary) hypertension: Secondary | ICD-10-CM | POA: Diagnosis not present

## 2020-12-22 DIAGNOSIS — R7303 Prediabetes: Secondary | ICD-10-CM | POA: Diagnosis not present

## 2020-12-22 DIAGNOSIS — N182 Chronic kidney disease, stage 2 (mild): Secondary | ICD-10-CM | POA: Diagnosis not present

## 2020-12-22 DIAGNOSIS — E782 Mixed hyperlipidemia: Secondary | ICD-10-CM | POA: Diagnosis not present

## 2020-12-26 DIAGNOSIS — E782 Mixed hyperlipidemia: Secondary | ICD-10-CM | POA: Diagnosis not present

## 2020-12-26 DIAGNOSIS — Z0000 Encounter for general adult medical examination without abnormal findings: Secondary | ICD-10-CM | POA: Diagnosis not present

## 2020-12-26 DIAGNOSIS — R7303 Prediabetes: Secondary | ICD-10-CM | POA: Diagnosis not present

## 2020-12-26 DIAGNOSIS — I10 Essential (primary) hypertension: Secondary | ICD-10-CM | POA: Diagnosis not present

## 2021-01-02 DIAGNOSIS — M5416 Radiculopathy, lumbar region: Secondary | ICD-10-CM | POA: Diagnosis not present

## 2021-01-02 DIAGNOSIS — M5116 Intervertebral disc disorders with radiculopathy, lumbar region: Secondary | ICD-10-CM | POA: Diagnosis not present

## 2021-02-16 DIAGNOSIS — M150 Primary generalized (osteo)arthritis: Secondary | ICD-10-CM | POA: Diagnosis not present

## 2021-02-16 DIAGNOSIS — N182 Chronic kidney disease, stage 2 (mild): Secondary | ICD-10-CM | POA: Diagnosis not present

## 2021-02-16 DIAGNOSIS — E782 Mixed hyperlipidemia: Secondary | ICD-10-CM | POA: Diagnosis not present

## 2021-02-16 DIAGNOSIS — I129 Hypertensive chronic kidney disease with stage 1 through stage 4 chronic kidney disease, or unspecified chronic kidney disease: Secondary | ICD-10-CM | POA: Diagnosis not present

## 2021-03-18 DIAGNOSIS — E782 Mixed hyperlipidemia: Secondary | ICD-10-CM | POA: Diagnosis not present

## 2021-03-18 DIAGNOSIS — I129 Hypertensive chronic kidney disease with stage 1 through stage 4 chronic kidney disease, or unspecified chronic kidney disease: Secondary | ICD-10-CM | POA: Diagnosis not present

## 2021-03-18 DIAGNOSIS — M150 Primary generalized (osteo)arthritis: Secondary | ICD-10-CM | POA: Diagnosis not present

## 2021-03-18 DIAGNOSIS — N182 Chronic kidney disease, stage 2 (mild): Secondary | ICD-10-CM | POA: Diagnosis not present

## 2021-03-20 DIAGNOSIS — I129 Hypertensive chronic kidney disease with stage 1 through stage 4 chronic kidney disease, or unspecified chronic kidney disease: Secondary | ICD-10-CM | POA: Diagnosis not present

## 2021-03-20 DIAGNOSIS — N182 Chronic kidney disease, stage 2 (mild): Secondary | ICD-10-CM | POA: Diagnosis not present

## 2021-03-20 DIAGNOSIS — E782 Mixed hyperlipidemia: Secondary | ICD-10-CM | POA: Diagnosis not present

## 2021-04-03 DIAGNOSIS — M5116 Intervertebral disc disorders with radiculopathy, lumbar region: Secondary | ICD-10-CM | POA: Diagnosis not present

## 2021-04-03 DIAGNOSIS — M5416 Radiculopathy, lumbar region: Secondary | ICD-10-CM | POA: Diagnosis not present

## 2021-04-18 DIAGNOSIS — N182 Chronic kidney disease, stage 2 (mild): Secondary | ICD-10-CM | POA: Diagnosis not present

## 2021-04-18 DIAGNOSIS — E782 Mixed hyperlipidemia: Secondary | ICD-10-CM | POA: Diagnosis not present

## 2021-04-18 DIAGNOSIS — I129 Hypertensive chronic kidney disease with stage 1 through stage 4 chronic kidney disease, or unspecified chronic kidney disease: Secondary | ICD-10-CM | POA: Diagnosis not present

## 2021-05-18 DIAGNOSIS — M150 Primary generalized (osteo)arthritis: Secondary | ICD-10-CM | POA: Diagnosis not present

## 2021-05-18 DIAGNOSIS — N182 Chronic kidney disease, stage 2 (mild): Secondary | ICD-10-CM | POA: Diagnosis not present

## 2021-05-18 DIAGNOSIS — I129 Hypertensive chronic kidney disease with stage 1 through stage 4 chronic kidney disease, or unspecified chronic kidney disease: Secondary | ICD-10-CM | POA: Diagnosis not present

## 2021-05-18 DIAGNOSIS — E782 Mixed hyperlipidemia: Secondary | ICD-10-CM | POA: Diagnosis not present

## 2021-06-18 DIAGNOSIS — I129 Hypertensive chronic kidney disease with stage 1 through stage 4 chronic kidney disease, or unspecified chronic kidney disease: Secondary | ICD-10-CM | POA: Diagnosis not present

## 2021-06-18 DIAGNOSIS — N182 Chronic kidney disease, stage 2 (mild): Secondary | ICD-10-CM | POA: Diagnosis not present

## 2021-06-18 DIAGNOSIS — E782 Mixed hyperlipidemia: Secondary | ICD-10-CM | POA: Diagnosis not present

## 2021-07-18 DIAGNOSIS — M5416 Radiculopathy, lumbar region: Secondary | ICD-10-CM | POA: Diagnosis not present

## 2021-07-18 DIAGNOSIS — M5116 Intervertebral disc disorders with radiculopathy, lumbar region: Secondary | ICD-10-CM | POA: Diagnosis not present

## 2021-07-19 DIAGNOSIS — E782 Mixed hyperlipidemia: Secondary | ICD-10-CM | POA: Diagnosis not present

## 2021-07-19 DIAGNOSIS — M150 Primary generalized (osteo)arthritis: Secondary | ICD-10-CM | POA: Diagnosis not present

## 2021-07-19 DIAGNOSIS — N182 Chronic kidney disease, stage 2 (mild): Secondary | ICD-10-CM | POA: Diagnosis not present

## 2021-07-19 DIAGNOSIS — I129 Hypertensive chronic kidney disease with stage 1 through stage 4 chronic kidney disease, or unspecified chronic kidney disease: Secondary | ICD-10-CM | POA: Diagnosis not present

## 2021-08-18 DIAGNOSIS — N182 Chronic kidney disease, stage 2 (mild): Secondary | ICD-10-CM | POA: Diagnosis not present

## 2021-08-18 DIAGNOSIS — M150 Primary generalized (osteo)arthritis: Secondary | ICD-10-CM | POA: Diagnosis not present

## 2021-08-18 DIAGNOSIS — E782 Mixed hyperlipidemia: Secondary | ICD-10-CM | POA: Diagnosis not present

## 2021-08-18 DIAGNOSIS — I129 Hypertensive chronic kidney disease with stage 1 through stage 4 chronic kidney disease, or unspecified chronic kidney disease: Secondary | ICD-10-CM | POA: Diagnosis not present

## 2021-08-21 DIAGNOSIS — R7303 Prediabetes: Secondary | ICD-10-CM | POA: Diagnosis not present

## 2021-08-21 DIAGNOSIS — E782 Mixed hyperlipidemia: Secondary | ICD-10-CM | POA: Diagnosis not present

## 2021-08-28 DIAGNOSIS — N182 Chronic kidney disease, stage 2 (mild): Secondary | ICD-10-CM | POA: Diagnosis not present

## 2021-08-28 DIAGNOSIS — N529 Male erectile dysfunction, unspecified: Secondary | ICD-10-CM | POA: Diagnosis not present

## 2021-08-28 DIAGNOSIS — I10 Essential (primary) hypertension: Secondary | ICD-10-CM | POA: Diagnosis not present

## 2021-08-28 DIAGNOSIS — E782 Mixed hyperlipidemia: Secondary | ICD-10-CM | POA: Diagnosis not present

## 2021-08-28 DIAGNOSIS — R7303 Prediabetes: Secondary | ICD-10-CM | POA: Diagnosis not present

## 2021-09-18 DIAGNOSIS — I129 Hypertensive chronic kidney disease with stage 1 through stage 4 chronic kidney disease, or unspecified chronic kidney disease: Secondary | ICD-10-CM | POA: Diagnosis not present

## 2021-09-18 DIAGNOSIS — N182 Chronic kidney disease, stage 2 (mild): Secondary | ICD-10-CM | POA: Diagnosis not present

## 2021-09-18 DIAGNOSIS — M150 Primary generalized (osteo)arthritis: Secondary | ICD-10-CM | POA: Diagnosis not present

## 2021-09-18 DIAGNOSIS — E782 Mixed hyperlipidemia: Secondary | ICD-10-CM | POA: Diagnosis not present

## 2021-09-27 DIAGNOSIS — I10 Essential (primary) hypertension: Secondary | ICD-10-CM | POA: Diagnosis not present

## 2021-10-18 DIAGNOSIS — N182 Chronic kidney disease, stage 2 (mild): Secondary | ICD-10-CM | POA: Diagnosis not present

## 2021-10-18 DIAGNOSIS — M150 Primary generalized (osteo)arthritis: Secondary | ICD-10-CM | POA: Diagnosis not present

## 2021-10-18 DIAGNOSIS — I129 Hypertensive chronic kidney disease with stage 1 through stage 4 chronic kidney disease, or unspecified chronic kidney disease: Secondary | ICD-10-CM | POA: Diagnosis not present

## 2021-10-18 DIAGNOSIS — E782 Mixed hyperlipidemia: Secondary | ICD-10-CM | POA: Diagnosis not present

## 2021-10-23 DIAGNOSIS — I10 Essential (primary) hypertension: Secondary | ICD-10-CM | POA: Diagnosis not present

## 2021-10-23 DIAGNOSIS — E782 Mixed hyperlipidemia: Secondary | ICD-10-CM | POA: Diagnosis not present

## 2021-10-24 DIAGNOSIS — Z1211 Encounter for screening for malignant neoplasm of colon: Secondary | ICD-10-CM | POA: Diagnosis not present

## 2021-10-25 DIAGNOSIS — R7303 Prediabetes: Secondary | ICD-10-CM | POA: Diagnosis not present

## 2021-10-25 DIAGNOSIS — I10 Essential (primary) hypertension: Secondary | ICD-10-CM | POA: Diagnosis not present

## 2021-10-25 DIAGNOSIS — N182 Chronic kidney disease, stage 2 (mild): Secondary | ICD-10-CM | POA: Diagnosis not present

## 2021-10-25 DIAGNOSIS — Z23 Encounter for immunization: Secondary | ICD-10-CM | POA: Diagnosis not present

## 2021-10-25 DIAGNOSIS — E782 Mixed hyperlipidemia: Secondary | ICD-10-CM | POA: Diagnosis not present

## 2021-10-31 DIAGNOSIS — M5416 Radiculopathy, lumbar region: Secondary | ICD-10-CM | POA: Diagnosis not present

## 2021-10-31 DIAGNOSIS — M5116 Intervertebral disc disorders with radiculopathy, lumbar region: Secondary | ICD-10-CM | POA: Diagnosis not present

## 2021-11-17 DIAGNOSIS — M150 Primary generalized (osteo)arthritis: Secondary | ICD-10-CM | POA: Diagnosis not present

## 2021-11-17 DIAGNOSIS — N182 Chronic kidney disease, stage 2 (mild): Secondary | ICD-10-CM | POA: Diagnosis not present

## 2021-11-17 DIAGNOSIS — E782 Mixed hyperlipidemia: Secondary | ICD-10-CM | POA: Diagnosis not present

## 2021-11-17 DIAGNOSIS — I129 Hypertensive chronic kidney disease with stage 1 through stage 4 chronic kidney disease, or unspecified chronic kidney disease: Secondary | ICD-10-CM | POA: Diagnosis not present

## 2021-12-06 DIAGNOSIS — M48061 Spinal stenosis, lumbar region without neurogenic claudication: Secondary | ICD-10-CM | POA: Diagnosis not present

## 2021-12-19 DIAGNOSIS — M545 Low back pain, unspecified: Secondary | ICD-10-CM | POA: Diagnosis not present

## 2021-12-19 DIAGNOSIS — E782 Mixed hyperlipidemia: Secondary | ICD-10-CM | POA: Diagnosis not present

## 2021-12-19 DIAGNOSIS — M48061 Spinal stenosis, lumbar region without neurogenic claudication: Secondary | ICD-10-CM | POA: Diagnosis not present

## 2021-12-19 DIAGNOSIS — I129 Hypertensive chronic kidney disease with stage 1 through stage 4 chronic kidney disease, or unspecified chronic kidney disease: Secondary | ICD-10-CM | POA: Diagnosis not present

## 2021-12-19 DIAGNOSIS — N182 Chronic kidney disease, stage 2 (mild): Secondary | ICD-10-CM | POA: Diagnosis not present

## 2021-12-19 DIAGNOSIS — M150 Primary generalized (osteo)arthritis: Secondary | ICD-10-CM | POA: Diagnosis not present

## 2022-01-16 DIAGNOSIS — E782 Mixed hyperlipidemia: Secondary | ICD-10-CM | POA: Diagnosis not present

## 2022-01-16 DIAGNOSIS — M150 Primary generalized (osteo)arthritis: Secondary | ICD-10-CM | POA: Diagnosis not present

## 2022-01-16 DIAGNOSIS — I129 Hypertensive chronic kidney disease with stage 1 through stage 4 chronic kidney disease, or unspecified chronic kidney disease: Secondary | ICD-10-CM | POA: Diagnosis not present

## 2022-01-16 DIAGNOSIS — N182 Chronic kidney disease, stage 2 (mild): Secondary | ICD-10-CM | POA: Diagnosis not present

## 2022-01-25 DIAGNOSIS — M5116 Intervertebral disc disorders with radiculopathy, lumbar region: Secondary | ICD-10-CM | POA: Diagnosis not present

## 2022-01-25 DIAGNOSIS — M5416 Radiculopathy, lumbar region: Secondary | ICD-10-CM | POA: Diagnosis not present

## 2022-01-31 ENCOUNTER — Ambulatory Visit: Payer: PPO | Admitting: Dermatology

## 2022-01-31 ENCOUNTER — Other Ambulatory Visit: Payer: Self-pay

## 2022-01-31 ENCOUNTER — Encounter: Payer: Self-pay | Admitting: Dermatology

## 2022-01-31 DIAGNOSIS — Z1283 Encounter for screening for malignant neoplasm of skin: Secondary | ICD-10-CM | POA: Diagnosis not present

## 2022-01-31 DIAGNOSIS — L821 Other seborrheic keratosis: Secondary | ICD-10-CM | POA: Diagnosis not present

## 2022-01-31 DIAGNOSIS — M4150 Other secondary scoliosis, site unspecified: Secondary | ICD-10-CM | POA: Diagnosis not present

## 2022-01-31 DIAGNOSIS — R200 Anesthesia of skin: Secondary | ICD-10-CM | POA: Diagnosis not present

## 2022-01-31 DIAGNOSIS — R202 Paresthesia of skin: Secondary | ICD-10-CM | POA: Diagnosis not present

## 2022-01-31 DIAGNOSIS — M4316 Spondylolisthesis, lumbar region: Secondary | ICD-10-CM | POA: Diagnosis not present

## 2022-01-31 DIAGNOSIS — L570 Actinic keratosis: Secondary | ICD-10-CM | POA: Diagnosis not present

## 2022-01-31 DIAGNOSIS — D1801 Hemangioma of skin and subcutaneous tissue: Secondary | ICD-10-CM | POA: Diagnosis not present

## 2022-01-31 DIAGNOSIS — L309 Dermatitis, unspecified: Secondary | ICD-10-CM | POA: Diagnosis not present

## 2022-01-31 DIAGNOSIS — R29898 Other symptoms and signs involving the musculoskeletal system: Secondary | ICD-10-CM | POA: Diagnosis not present

## 2022-01-31 MED ORDER — HYDROCORTISONE (PERIANAL) 2.5 % EX CREA: 1.0000 "application " | TOPICAL_CREAM | Freq: Two times a day (BID) | CUTANEOUS | 0 refills | Status: DC

## 2022-01-31 NOTE — Patient Instructions (Addendum)
FOR THE BRUISING, PICK UP OVER THE COUNTER "DERMEND" - CAN ALSO BE PURCHASED ON [employer]  ?

## 2022-02-12 NOTE — Progress Notes (Signed)
? ?  New Patient ?  ?Subjective  ?Blake Cox is a 71 y.o. male who presents for the following: Annual Exam (Pt here for annual exam. Pt has some spots of concern on the scalp and ear. No discomfort). ? ?General skin examination, several places of concern ?Location:  ?Duration:  ?Quality:  ?Associated Signs/Symptoms: ?Modifying Factors:  ?Severity:  ?Timing: ?Context:  ? ? ?The following portions of the chart were reviewed this encounter and updated as appropriate:  Allergies  Problems  Med Hx  Surg Hx  Fam Hx   ?  ? ?Objective  ?Well appearing patient in no apparent distress; mood and affect are within normal limits. ?General skin examination: No atypical pigmented lesions or current nonmelanoma skin cancer ? ?Multiple 1 mm smooth red dermal papules ? ?Dawson-+4 to 8 mm brown flattopped textured papules with typical dermoscopy ? ?Left Anterior Lobule, Left Inferior Helix, Left Temple, Right Superior Helix, Right Temple ?Gritty and hornlike 3 to 4 mm pink crusts ? ?Right Hip (side) - Posterior ?History of itchy rash that comes and goes.  Patient requests refill on generic Proctozone which always helps. ? ? ? ?A full examination was performed including scalp, head, eyes, ears, nose, lips, neck, chest, axillae, abdomen, back, buttocks, bilateral upper extremities, bilateral lower extremities, hands, feet, fingers, toes, fingernails, and toenails. All findings within normal limits unless otherwise noted below.  Areas beneath undergarments and feet not fully examined. ? ? ?Assessment & Plan  ?Screening exam for skin cancer ? ?Annual skin examination, encouraged to self examine twice annually. ? ?Cherry angioma ? ?No intervention necessary ? ?Seborrheic keratosis ? ?Leave if stable ? ?Actinic keratosis (5) ?Right Superior Helix; Left Inferior Helix; Left Anterior Lobule; Left Temple; Right Temple ? ?Destruction of lesion - Left Anterior Lobule, Left Inferior Helix, Left Temple, Right Superior Helix, Right  Temple ?Complexity: simple   ?Destruction method: cryotherapy   ?Informed consent: discussed and consent obtained   ?Timeout:  patient name, date of birth, surgical site, and procedure verified ?Lesion destroyed using liquid nitrogen: Yes   ?Cryotherapy cycles:  3 ?Outcome: patient tolerated procedure well with no complications   ?Post-procedure details: wound care instructions given   ? ?Dermatitis ?Right Hip (side) - Posterior ? ?Okay refills proctozone-HC ? ?Related Medications ?hydrocortisone (PROCTOZONE-HC) 2.5 % rectal cream ?Place 1 application. rectally 2 (two) times daily. ? ? ?

## 2022-02-13 DIAGNOSIS — Z23 Encounter for immunization: Secondary | ICD-10-CM | POA: Diagnosis not present

## 2022-02-13 DIAGNOSIS — R5383 Other fatigue: Secondary | ICD-10-CM | POA: Diagnosis not present

## 2022-02-13 DIAGNOSIS — I10 Essential (primary) hypertension: Secondary | ICD-10-CM | POA: Diagnosis not present

## 2022-02-13 DIAGNOSIS — R7303 Prediabetes: Secondary | ICD-10-CM | POA: Diagnosis not present

## 2022-02-13 DIAGNOSIS — E782 Mixed hyperlipidemia: Secondary | ICD-10-CM | POA: Diagnosis not present

## 2022-02-13 DIAGNOSIS — Z0000 Encounter for general adult medical examination without abnormal findings: Secondary | ICD-10-CM | POA: Diagnosis not present

## 2022-02-13 DIAGNOSIS — N182 Chronic kidney disease, stage 2 (mild): Secondary | ICD-10-CM | POA: Diagnosis not present

## 2022-02-16 DIAGNOSIS — E782 Mixed hyperlipidemia: Secondary | ICD-10-CM | POA: Diagnosis not present

## 2022-02-16 DIAGNOSIS — M150 Primary generalized (osteo)arthritis: Secondary | ICD-10-CM | POA: Diagnosis not present

## 2022-02-16 DIAGNOSIS — N182 Chronic kidney disease, stage 2 (mild): Secondary | ICD-10-CM | POA: Diagnosis not present

## 2022-02-16 DIAGNOSIS — I129 Hypertensive chronic kidney disease with stage 1 through stage 4 chronic kidney disease, or unspecified chronic kidney disease: Secondary | ICD-10-CM | POA: Diagnosis not present

## 2022-03-01 DIAGNOSIS — F419 Anxiety disorder, unspecified: Secondary | ICD-10-CM | POA: Diagnosis not present

## 2022-03-01 DIAGNOSIS — R7303 Prediabetes: Secondary | ICD-10-CM | POA: Diagnosis not present

## 2022-03-01 DIAGNOSIS — E782 Mixed hyperlipidemia: Secondary | ICD-10-CM | POA: Diagnosis not present

## 2022-03-01 DIAGNOSIS — N182 Chronic kidney disease, stage 2 (mild): Secondary | ICD-10-CM | POA: Diagnosis not present

## 2022-03-01 DIAGNOSIS — I10 Essential (primary) hypertension: Secondary | ICD-10-CM | POA: Diagnosis not present

## 2022-03-01 DIAGNOSIS — R002 Palpitations: Secondary | ICD-10-CM | POA: Diagnosis not present

## 2022-03-01 DIAGNOSIS — G8929 Other chronic pain: Secondary | ICD-10-CM | POA: Diagnosis not present

## 2022-03-01 DIAGNOSIS — M5440 Lumbago with sciatica, unspecified side: Secondary | ICD-10-CM | POA: Diagnosis not present

## 2022-03-01 DIAGNOSIS — Z0000 Encounter for general adult medical examination without abnormal findings: Secondary | ICD-10-CM | POA: Diagnosis not present

## 2022-03-05 DIAGNOSIS — M4150 Other secondary scoliosis, site unspecified: Secondary | ICD-10-CM | POA: Diagnosis not present

## 2022-03-05 DIAGNOSIS — M48061 Spinal stenosis, lumbar region without neurogenic claudication: Secondary | ICD-10-CM | POA: Diagnosis not present

## 2022-03-05 DIAGNOSIS — Z6826 Body mass index (BMI) 26.0-26.9, adult: Secondary | ICD-10-CM | POA: Diagnosis not present

## 2022-03-18 DIAGNOSIS — N182 Chronic kidney disease, stage 2 (mild): Secondary | ICD-10-CM | POA: Diagnosis not present

## 2022-03-18 DIAGNOSIS — I129 Hypertensive chronic kidney disease with stage 1 through stage 4 chronic kidney disease, or unspecified chronic kidney disease: Secondary | ICD-10-CM | POA: Diagnosis not present

## 2022-03-18 DIAGNOSIS — E782 Mixed hyperlipidemia: Secondary | ICD-10-CM | POA: Diagnosis not present

## 2022-03-18 DIAGNOSIS — M150 Primary generalized (osteo)arthritis: Secondary | ICD-10-CM | POA: Diagnosis not present

## 2022-05-24 DIAGNOSIS — R7303 Prediabetes: Secondary | ICD-10-CM | POA: Diagnosis not present

## 2022-05-24 DIAGNOSIS — Z125 Encounter for screening for malignant neoplasm of prostate: Secondary | ICD-10-CM | POA: Diagnosis not present

## 2022-06-07 DIAGNOSIS — M5416 Radiculopathy, lumbar region: Secondary | ICD-10-CM | POA: Diagnosis not present

## 2022-06-07 DIAGNOSIS — M5116 Intervertebral disc disorders with radiculopathy, lumbar region: Secondary | ICD-10-CM | POA: Diagnosis not present

## 2022-08-08 DIAGNOSIS — K219 Gastro-esophageal reflux disease without esophagitis: Secondary | ICD-10-CM | POA: Diagnosis not present

## 2022-08-08 DIAGNOSIS — Z86010 Personal history of colonic polyps: Secondary | ICD-10-CM | POA: Diagnosis not present

## 2022-08-22 DIAGNOSIS — M48061 Spinal stenosis, lumbar region without neurogenic claudication: Secondary | ICD-10-CM | POA: Diagnosis not present

## 2022-08-29 DIAGNOSIS — Z86010 Personal history of colonic polyps: Secondary | ICD-10-CM | POA: Diagnosis not present

## 2022-08-29 DIAGNOSIS — D123 Benign neoplasm of transverse colon: Secondary | ICD-10-CM | POA: Diagnosis not present

## 2022-08-29 DIAGNOSIS — K5732 Diverticulitis of large intestine without perforation or abscess without bleeding: Secondary | ICD-10-CM | POA: Diagnosis not present

## 2022-08-29 DIAGNOSIS — K5730 Diverticulosis of large intestine without perforation or abscess without bleeding: Secondary | ICD-10-CM | POA: Diagnosis not present

## 2022-08-29 DIAGNOSIS — K635 Polyp of colon: Secondary | ICD-10-CM | POA: Diagnosis not present

## 2022-09-10 DIAGNOSIS — M48062 Spinal stenosis, lumbar region with neurogenic claudication: Secondary | ICD-10-CM | POA: Diagnosis not present

## 2022-09-10 DIAGNOSIS — M5416 Radiculopathy, lumbar region: Secondary | ICD-10-CM | POA: Diagnosis not present

## 2022-09-10 DIAGNOSIS — M48061 Spinal stenosis, lumbar region without neurogenic claudication: Secondary | ICD-10-CM | POA: Diagnosis not present

## 2022-09-22 ENCOUNTER — Other Ambulatory Visit (HOSPITAL_BASED_OUTPATIENT_CLINIC_OR_DEPARTMENT_OTHER): Payer: Self-pay | Admitting: Neurological Surgery

## 2022-09-22 ENCOUNTER — Ambulatory Visit (HOSPITAL_BASED_OUTPATIENT_CLINIC_OR_DEPARTMENT_OTHER)
Admission: RE | Admit: 2022-09-22 | Discharge: 2022-09-22 | Disposition: A | Discharge status: Home / Self Care | Payer: PPO | Source: Ambulatory Visit | Attending: Neurological Surgery | Admitting: Neurological Surgery

## 2022-09-22 DIAGNOSIS — M48061 Spinal stenosis, lumbar region without neurogenic claudication: Secondary | ICD-10-CM | POA: Diagnosis not present

## 2022-09-22 DIAGNOSIS — M79661 Pain in right lower leg: Secondary | ICD-10-CM | POA: Diagnosis not present

## 2022-09-22 DIAGNOSIS — M79662 Pain in left lower leg: Secondary | ICD-10-CM | POA: Diagnosis not present

## 2022-10-17 DIAGNOSIS — I10 Essential (primary) hypertension: Secondary | ICD-10-CM | POA: Diagnosis not present

## 2022-10-17 DIAGNOSIS — E782 Mixed hyperlipidemia: Secondary | ICD-10-CM | POA: Diagnosis not present

## 2022-10-17 DIAGNOSIS — F419 Anxiety disorder, unspecified: Secondary | ICD-10-CM | POA: Diagnosis not present

## 2022-10-17 DIAGNOSIS — R7303 Prediabetes: Secondary | ICD-10-CM | POA: Diagnosis not present

## 2022-10-17 DIAGNOSIS — N182 Chronic kidney disease, stage 2 (mild): Secondary | ICD-10-CM | POA: Diagnosis not present

## 2022-10-19 DIAGNOSIS — I10 Essential (primary) hypertension: Secondary | ICD-10-CM | POA: Diagnosis not present

## 2022-10-19 DIAGNOSIS — R7303 Prediabetes: Secondary | ICD-10-CM | POA: Diagnosis not present

## 2022-10-19 DIAGNOSIS — M5440 Lumbago with sciatica, unspecified side: Secondary | ICD-10-CM | POA: Diagnosis not present

## 2022-10-19 DIAGNOSIS — Z23 Encounter for immunization: Secondary | ICD-10-CM | POA: Diagnosis not present

## 2022-10-19 DIAGNOSIS — E782 Mixed hyperlipidemia: Secondary | ICD-10-CM | POA: Diagnosis not present

## 2022-10-19 DIAGNOSIS — G8929 Other chronic pain: Secondary | ICD-10-CM | POA: Diagnosis not present

## 2022-10-19 DIAGNOSIS — N182 Chronic kidney disease, stage 2 (mild): Secondary | ICD-10-CM | POA: Diagnosis not present

## 2022-10-19 DIAGNOSIS — F419 Anxiety disorder, unspecified: Secondary | ICD-10-CM | POA: Diagnosis not present

## 2022-10-26 DIAGNOSIS — R059 Cough, unspecified: Secondary | ICD-10-CM | POA: Diagnosis not present

## 2022-11-21 DIAGNOSIS — M48061 Spinal stenosis, lumbar region without neurogenic claudication: Secondary | ICD-10-CM | POA: Diagnosis not present

## 2022-11-28 DIAGNOSIS — M48061 Spinal stenosis, lumbar region without neurogenic claudication: Secondary | ICD-10-CM | POA: Diagnosis not present

## 2022-11-28 DIAGNOSIS — M4316 Spondylolisthesis, lumbar region: Secondary | ICD-10-CM | POA: Diagnosis not present

## 2022-11-28 DIAGNOSIS — M545 Low back pain, unspecified: Secondary | ICD-10-CM | POA: Diagnosis not present

## 2022-12-05 DIAGNOSIS — M48061 Spinal stenosis, lumbar region without neurogenic claudication: Secondary | ICD-10-CM | POA: Diagnosis not present

## 2022-12-05 DIAGNOSIS — Z6828 Body mass index (BMI) 28.0-28.9, adult: Secondary | ICD-10-CM | POA: Diagnosis not present

## 2022-12-14 DIAGNOSIS — M48061 Spinal stenosis, lumbar region without neurogenic claudication: Secondary | ICD-10-CM | POA: Diagnosis not present

## 2022-12-14 DIAGNOSIS — M545 Low back pain, unspecified: Secondary | ICD-10-CM | POA: Diagnosis not present

## 2022-12-19 DIAGNOSIS — M48061 Spinal stenosis, lumbar region without neurogenic claudication: Secondary | ICD-10-CM | POA: Diagnosis not present

## 2022-12-19 DIAGNOSIS — M545 Low back pain, unspecified: Secondary | ICD-10-CM | POA: Diagnosis not present

## 2022-12-20 HISTORY — PX: CERVICAL SPINE SURGERY: SHX589

## 2022-12-25 ENCOUNTER — Other Ambulatory Visit: Payer: Self-pay | Admitting: Neurological Surgery

## 2023-01-01 DIAGNOSIS — L814 Other melanin hyperpigmentation: Secondary | ICD-10-CM | POA: Diagnosis not present

## 2023-01-01 DIAGNOSIS — L821 Other seborrheic keratosis: Secondary | ICD-10-CM | POA: Diagnosis not present

## 2023-01-01 DIAGNOSIS — D225 Melanocytic nevi of trunk: Secondary | ICD-10-CM | POA: Diagnosis not present

## 2023-01-01 DIAGNOSIS — L570 Actinic keratosis: Secondary | ICD-10-CM | POA: Diagnosis not present

## 2023-01-01 DIAGNOSIS — L298 Other pruritus: Secondary | ICD-10-CM | POA: Diagnosis not present

## 2023-01-02 NOTE — Pre-Procedure Instructions (Signed)
Surgical Instructions    Your procedure is scheduled on Tuesday, February 20th.  Report to [facility] Main Entrance "A" at 05:30 A.M., then check in with the Admitting office.  Call this number if you have problems the morning of surgery:  [phone]  If you have any questions prior to your surgery date call [phone]: Open Monday-Friday 8am-4pm If you experience any cold or flu symptoms such as cough, fever, chills, shortness of breath, etc. between now and your scheduled surgery, please notify us at the above number.     Remember:  Do not eat after midnight the night before your surgery  You may drink clear liquids until 04:30 AM the morning of your surgery.   Clear liquids allowed are: Water, Non-Citrus Juices (without pulp), Carbonated Beverages, Clear Tea, Black Coffee Only (NO MILK, CREAM OR POWDERED CREAMER of any kind), and Gatorade.    Take these medicines the morning of surgery with A SIP OF WATER  cetirizine (ZYRTEC)  DEXILANT  rosuvastatin (CRESTOR)   If needed: acetaminophen (TYLENOL)  promethazine (PHENERGAN)     As of today, STOP taking any Aspirin (unless otherwise instructed by your surgeon) Aleve, Naproxen, Ibuprofen, Motrin, Advil, Goody's, BC's, all herbal medications, fish oil, and all vitamins.                     Do NOT Smoke (Tobacco/Vaping) for 24 hours prior to your procedure.  If you use a CPAP at night, you may bring your mask/headgear for your overnight stay.   Contacts, glasses, piercing's, hearing aid's, dentures or partials may not be worn into surgery, please bring cases for these belongings.    For patients admitted to the hospital, discharge time will be determined by your treatment team.   Patients discharged the day of surgery will not be allowed to drive home, and someone needs to stay with them for 24 hours.  SURGICAL WAITING ROOM VISITATION Patients having surgery or a procedure may have no more than 2 support people in the  waiting area - these visitors may rotate.   Children under the age of 34 must have an adult with them who is not the patient. If the patient needs to stay at the hospital during part of their recovery, the visitor guidelines for inpatient rooms apply. Pre-op nurse will coordinate an appropriate time for 1 support person to accompany patient in pre-op.  This support person may not rotate.   Please refer to the [facility] website for the visitor guidelines for Inpatients (after your surgery is over and you are in a regular room).    Special instructions:   [place]- Preparing For Surgery  Before surgery, you can play an important role. Because skin is not sterile, your skin needs to be as free of germs as possible. You can reduce the number of germs on your skin by washing with CHG (chlorahexidine gluconate) Soap before surgery.  CHG is an antiseptic cleaner which kills germs and bonds with the skin to continue killing germs even after washing.    Oral Hygiene is also important to reduce your risk of infection.  Remember - BRUSH YOUR TEETH THE MORNING OF SURGERY WITH YOUR REGULAR TOOTHPASTE  Please do not use if you have an allergy to CHG or antibacterial soaps. If your skin becomes reddened/irritated stop using the CHG.  Do not shave (including legs and underarms) for at least 48 hours prior to first CHG shower. It is OK to shave your face.  Please follow these instructions carefully.   Shower the NIGHT BEFORE SURGERY and the MORNING OF SURGERY  If you chose to wash your hair, wash your hair first as usual with your normal shampoo.  After you shampoo, rinse your hair and body thoroughly to remove the shampoo.  Use CHG Soap as you would any other liquid soap. You can apply CHG directly to the skin and wash gently with a scrungie or a clean washcloth.   Apply the CHG Soap to your body ONLY FROM THE NECK DOWN.  Do not use on open wounds or open sores. Avoid contact with your eyes, ears,  mouth and genitals (private parts). Wash Face and genitals (private parts)  with your normal soap.   Wash thoroughly, paying special attention to the area where your surgery will be performed.  Thoroughly rinse your body with warm water from the neck down.  DO NOT shower/wash with your normal soap after using and rinsing off the CHG Soap.  Pat yourself dry with a CLEAN TOWEL.  Wear CLEAN PAJAMAS to bed the night before surgery  Place CLEAN SHEETS on your bed the night before your surgery  DO NOT SLEEP WITH PETS.   Day of Surgery: Take a shower with CHG soap. Do not wear jewelry or makeup Do not wear lotions, powders, colognes, or deodorant. Men may shave face and neck. Do not bring valuables to the hospital.  [facility] is not responsible for any belongings or valuables.  Wear Clean/Comfortable clothing the morning of surgery Remember to brush your teeth WITH YOUR REGULAR TOOTHPASTE.   Please read over the following fact sheets that you were given.    If you received a COVID test during your pre-op visit  it is requested that you wear a mask when out in public, stay away from anyone that may not be feeling well and notify your surgeon if you develop symptoms. If you have been in contact with anyone that has tested positive in the last 10 days please notify you surgeon.

## 2023-01-03 ENCOUNTER — Encounter (HOSPITAL_COMMUNITY): Payer: Self-pay

## 2023-01-03 ENCOUNTER — Other Ambulatory Visit: Payer: Self-pay

## 2023-01-03 ENCOUNTER — Encounter (HOSPITAL_COMMUNITY)
Admission: RE | Admit: 2023-01-03 | Discharge: 2023-01-03 | Disposition: A | Discharge status: Home / Self Care | Payer: PPO | Source: Ambulatory Visit | Attending: Neurological Surgery | Admitting: Neurological Surgery

## 2023-01-03 VITALS — BP 127/93 | HR 86 | Temp 98.1°F | Resp 18 | Ht 70.0 in | Wt 191.5 lb

## 2023-01-03 DIAGNOSIS — Z01818 Encounter for other preprocedural examination: Secondary | ICD-10-CM | POA: Insufficient documentation

## 2023-01-03 DIAGNOSIS — I2510 Atherosclerotic heart disease of native coronary artery without angina pectoris: Secondary | ICD-10-CM | POA: Insufficient documentation

## 2023-01-03 HISTORY — DX: Chronic kidney disease, unspecified: N18.9

## 2023-01-03 HISTORY — DX: Other complications of anesthesia, initial encounter: T88.59XA

## 2023-01-03 HISTORY — DX: Disorder of lipoprotein metabolism, unspecified: E78.9

## 2023-01-03 HISTORY — DX: Prediabetes: R73.03

## 2023-01-03 HISTORY — DX: Essential (primary) hypertension: I10

## 2023-01-03 HISTORY — DX: Headache, unspecified: R51.9

## 2023-01-03 LAB — CBC
HCT: 51.7 % (ref 39.0–52.0)
Hemoglobin: 17.9 g/dL — ABNORMAL HIGH (ref 13.0–17.0)
MCH: 32.4 pg (ref 26.0–34.0)
MCHC: 34.6 g/dL (ref 30.0–36.0)
MCV: 93.5 fL (ref 80.0–100.0)
Platelets: 266 10*3/uL (ref 150–400)
RBC: 5.53 MIL/uL (ref 4.22–5.81)
RDW: 12.7 % (ref 11.5–15.5)
WBC: 6.6 10*3/uL (ref 4.0–10.5)
nRBC: 0 % (ref 0.0–0.2)

## 2023-01-03 LAB — BASIC METABOLIC PANEL
Anion gap: 11 (ref 5–15)
BUN: 23 mg/dL (ref 8–23)
CO2: 28 mmol/L (ref 22–32)
Calcium: 9.6 mg/dL (ref 8.9–10.3)
Chloride: 99 mmol/L (ref 98–111)
Creatinine, Ser: 1.19 mg/dL (ref 0.61–1.24)
GFR, Estimated: >60 mL/min (ref >60)
Glucose, Bld: 116 mg/dL — ABNORMAL HIGH (ref 70–99)
Potassium: 4.2 mmol/L (ref 3.5–5.1)
Sodium: 138 mmol/L (ref 135–145)

## 2023-01-03 LAB — TYPE AND SCREEN
ABO/RH(D): O POS
Antibody Screen: NEGATIVE

## 2023-01-03 LAB — SURGICAL PCR SCREEN
MRSA, PCR: NEGATIVE
Staphylococcus aureus: NEGATIVE

## 2023-01-03 NOTE — Progress Notes (Signed)
PCP - Merrilee Seashore Cardiologist - denies  PPM/ICD - denies   Chest x-ray - N/A EKG - 02/2022- requested from PCP- pt reports he had normal EKG with physical this year Stress Test - >10 years ago- normal per patient ECHO - pt unsure if he had an echo in the past Cardiac Cath - denies  Sleep Study - N/A   Fasting Blood Sugar - pt is pre-DM. Pt denies checking CBG- pt reports last A1c 5.8   Last dose of GLP1 agonist-  N/A   Blood Thinner Instructions: N/A   ERAS Protcol - ERAS per order   COVID TEST- N/A   Anesthesia review: review requested records  Patient denies shortness of breath, fever, cough and chest pain at PAT appointment   All instructions explained to the patient, with a verbal understanding of the material. Patient agrees to go over the instructions while at home for a better understanding. The opportunity to ask questions was provided.

## 2023-01-04 NOTE — Anesthesia Preprocedure Evaluation (Signed)
Anesthesia Evaluation  Patient identified by MRN, date of birth, ID band Patient awake    Reviewed: Allergy & Precautions, H&P , NPO status , Patient's Chart, lab work & pertinent test results  Airway Mallampati: III  TM Distance: >3 FB Neck ROM: Full    Dental no notable dental hx. (+) Teeth Intact, Dental Advisory Given   Pulmonary neg pulmonary ROS   Pulmonary exam normal breath sounds clear to auscultation       Cardiovascular Exercise Tolerance: Good hypertension, Pt. on medications  Rhythm:Regular Rate:Normal     Neuro/Psych  Headaches  negative psych ROS   GI/Hepatic negative GI ROS, Neg liver ROS,,,  Endo/Other  negative endocrine ROS    Renal/GU Renal InsufficiencyRenal disease  negative genitourinary   Musculoskeletal   Abdominal   Peds  Hematology negative hematology ROS (+)   Anesthesia Other Findings   Reproductive/Obstetrics negative OB ROS                             Anesthesia Physical Anesthesia Plan  ASA: 3  Anesthesia Plan: General   Post-op Pain Management: Tylenol PO (pre-op)*   Induction: Intravenous  PONV Risk Score and Plan: 3 and Ondansetron, Dexamethasone and Treatment may vary due to age or medical condition  Airway Management Planned: Oral ETT  Additional Equipment:   Intra-op Plan:   Post-operative Plan: Extubation in OR  Informed Consent: I have reviewed the patients History and Physical, chart, labs and discussed the procedure including the risks, benefits and alternatives for the proposed anesthesia with the patient or authorized representative who has indicated his/her understanding and acceptance.     Dental advisory given  Plan Discussed with: CRNA  Anesthesia Plan Comments: (PAT note written 01/04/2023 by Myra Gianotti, PA-C.  )       Anesthesia Quick Evaluation

## 2023-01-04 NOTE — Progress Notes (Signed)
Anesthesia Chart Review:  Case: [id number] Date/Time: 01/08/23 0715   Procedure: L3-4, L4-5 Posterior lumbar interbody fusion (Back) - 3C/RM 21   Anesthesia type: General   Pre-op diagnosis: Spinal stenosis, Lumbar region without neurogenic claudication   Location: [facility] OR ROOM 21 / [facility]   Surgeons: Kristeen Miss, MD       DISCUSSION: Patient is a 72 year old male scheduled for the above procedure.  History includes never smoker, HTN, prediabetes, borderline hypercholesterolemia, CKD (stage II), migraines, spinal surgery (C6-7 discectomy 12/12/00; L3-5 laminectomy 08/2022), dental implants, prolonged emergence with anesthesia.  Last PCP records from [facility]) received. Last visit with Merrilee Seashore, MD was on 10/19/22 for follow-up chronic medical conditions.  He was undergoing physical therapy at that time and seeing neurosurgery for his back pain.  A1c was 5.8% on 10/17/2022.  03/01/2022 EKG showed sinus rhythm, rightward axis.  He denied shortness of breath, cough, fever, chest pain at PAT RN visit.  Anesthesia team to evaluate on the day of surgery.  VS: BP (!) 127/93   Pulse 86   Temp 36.7 C   Resp 18   Ht 5' 10"$  (1.778 m)   Wt 86.9 kg   SpO2 100%   BMI 27.48 kg/m   PROVIDERS: Merrilee Seashore, MD is PCP    LABS: Labs reviewed: Acceptable for surgery. A1c was 5.8% on 10/17/2022 ([facility]). (all labs ordered are listed, but only abnormal results are displayed)  Labs Reviewed  BASIC METABOLIC PANEL - Abnormal; Notable for the following components:      Result Value   Glucose, Bld 116 (*)    All other components within normal limits  CBC - Abnormal; Notable for the following components:   Hemoglobin 17.9 (*)    All other components within normal limits  SURGICAL PCR SCREEN  TYPE AND SCREEN  Comparison HGB 16.2 02/13/22 ([facility])   IMAGES: MRI L-spine 11/28/22 (PACS/Canopy): IMPRESSION: 1. Interval laminectomy at L3-4 and L4-5 with decompression  of the central canal at both levels. 2. Mild residual subarticular narrowing bilaterally at L4-5 is worse on the right, predominantly due to facet spurring. 3. Right foraminotomy decompresses the foramen. 4. Mild right foraminal narrowing at L4-5 is improved. 5. Previously noted changes at L1-2, L2-3 and L5-S1 are stable    EKG: 03/01/22 ([facility]): Sinus rhythm.  Low voltage with rightward P axis and rotation, possible pulmonary disease.   CV: BLE Venous US 09/22/22: IMPRESSION: Negative bilateral lower extremity venous ultrasound.  No DVT.  Echo 05/13/19: MPRESSIONS   1. The left ventricle has normal systolic function, with an ejection  fraction of 55-60%. The cavity size was normal. Left ventricular diastolic  Doppler parameters are consistent with impaired relaxation.   2. The right ventricle has normal systolic function. The cavity was  normal.   3. The mitral valve is grossly normal.   4. The tricuspid valve is grossly normal.   5. The aortic valve is tricuspid. Mild thickening of the aortic valve. No  stenosis of the aortic valve.   6. There is mild dilatation of the ascending aorta measuring 40 mm.   7. The interatrial septum is aneurysmal.   8. Normal LV systolic function; proximal septal thickening; mild  diastolic dysfunction; mildly dilated ascending aorta.   Zio monitor 05/08/19-05/12/19:  Final interpretation: Occasional short supraventricular runs, no ventricular ectopy and no reported patient events.  He reported a normal stress test greater than 10 years ago.  Past Medical History:  Diagnosis Date  Borderline high cholesterol    Chronic kidney disease    per PCP note- pt unsure   Complication of anesthesia    "hard to come out of anesthesia"   Headache    migraines- worse in the past   Hypertension    Pre-diabetes     Past Surgical History:  Procedure Laterality Date   BACK SURGERY     Dr Ellene Route october 2023   CARPAL TUNNEL RELEASE Right    CERVICAL  SPINE SURGERY     Dr Ellene Route   dental implants     INJECTION KNEE Right     MEDICATIONS:  acetaminophen (TYLENOL) 650 MG CR tablet   Ascorbic Acid (VITAMIN C) 1000 MG tablet   cetirizine (ZYRTEC) 10 MG tablet   Cholecalciferol (DIALYVITE VITAMIN D 5000) 125 MCG (5000 UT) capsule   cyclobenzaprine (FLEXERIL) 10 MG tablet   DEXILANT 60 MG capsule   ibuprofen (ADVIL) 800 MG tablet   LORazepam (ATIVAN) 0.5 MG tablet   Magnesium 400 MG TABS   Probiotic, Lactobacillus, CAPS   promethazine (PHENERGAN) 25 MG tablet   rosuvastatin (CRESTOR) 20 MG tablet   sildenafil (REVATIO) 20 MG tablet   telmisartan-hydrochlorothiazide (MICARDIS HCT) 80-25 MG tablet   Zinc 50 MG TABS   No current facility-administered medications for this encounter.    Myra Gianotti, PA-C Surgical Short Stay/Anesthesiology [facility] Phone [phone] [facility] Phone [phone] 01/04/2023 3:06 PM

## 2023-01-08 ENCOUNTER — Other Ambulatory Visit: Payer: Self-pay

## 2023-01-08 ENCOUNTER — Inpatient Hospital Stay (HOSPITAL_COMMUNITY): Payer: PPO

## 2023-01-08 ENCOUNTER — Inpatient Hospital Stay (HOSPITAL_COMMUNITY)
Admission: RE | Disposition: A | Discharge status: Home / Self Care | Payer: Self-pay | Source: Home / Self Care | Attending: Neurological Surgery

## 2023-01-08 ENCOUNTER — Inpatient Hospital Stay (HOSPITAL_COMMUNITY): Payer: PPO | Admitting: Vascular Surgery

## 2023-01-08 ENCOUNTER — Inpatient Hospital Stay (HOSPITAL_COMMUNITY): Payer: PPO | Admitting: Anesthesiology

## 2023-01-08 ENCOUNTER — Inpatient Hospital Stay (HOSPITAL_COMMUNITY)
Admission: RE | Admit: 2023-01-08 | Discharge: 2023-01-10 | DRG: 455 | Disposition: A | Discharge status: Home / Self Care | Payer: PPO | Attending: Neurological Surgery | Admitting: Neurological Surgery

## 2023-01-08 DIAGNOSIS — M4156 Other secondary scoliosis, lumbar region: Secondary | ICD-10-CM | POA: Diagnosis present

## 2023-01-08 DIAGNOSIS — M48061 Spinal stenosis, lumbar region without neurogenic claudication: Principal | ICD-10-CM | POA: Diagnosis present

## 2023-01-08 DIAGNOSIS — Z981 Arthrodesis status: Secondary | ICD-10-CM | POA: Diagnosis not present

## 2023-01-08 DIAGNOSIS — I10 Essential (primary) hypertension: Secondary | ICD-10-CM

## 2023-01-08 DIAGNOSIS — R7303 Prediabetes: Secondary | ICD-10-CM | POA: Diagnosis present

## 2023-01-08 DIAGNOSIS — E7800 Pure hypercholesterolemia, unspecified: Secondary | ICD-10-CM | POA: Diagnosis not present

## 2023-01-08 DIAGNOSIS — M47816 Spondylosis without myelopathy or radiculopathy, lumbar region: Secondary | ICD-10-CM | POA: Diagnosis not present

## 2023-01-08 DIAGNOSIS — M4126 Other idiopathic scoliosis, lumbar region: Secondary | ICD-10-CM | POA: Diagnosis not present

## 2023-01-08 DIAGNOSIS — M5416 Radiculopathy, lumbar region: Secondary | ICD-10-CM

## 2023-01-08 DIAGNOSIS — M4316 Spondylolisthesis, lumbar region: Secondary | ICD-10-CM | POA: Diagnosis present

## 2023-01-08 DIAGNOSIS — M48062 Spinal stenosis, lumbar region with neurogenic claudication: Principal | ICD-10-CM | POA: Diagnosis present

## 2023-01-08 DIAGNOSIS — Z79899 Other long term (current) drug therapy: Secondary | ICD-10-CM | POA: Diagnosis not present

## 2023-01-08 DIAGNOSIS — N289 Disorder of kidney and ureter, unspecified: Secondary | ICD-10-CM | POA: Diagnosis not present

## 2023-01-08 LAB — ABO/RH: ABO/RH(D): O POS

## 2023-01-08 SURGERY — POSTERIOR LUMBAR FUSION 2 LEVEL
Anesthesia: General | Site: Back

## 2023-01-08 MED ORDER — LIDOCAINE-EPINEPHRINE 1 %-1:100000 IJ SOLN
INTRAMUSCULAR | Status: DC | PRN
  Administered 2023-01-08: 5 mL

## 2023-01-08 MED ORDER — PROMETHAZINE HCL 25 MG PO TABS: 25.0000 mg | ORAL_TABLET | Freq: Four times a day (QID) | ORAL | Status: DC | PRN

## 2023-01-08 MED ORDER — ALUM & MAG HYDROXIDE-SIMETH 200-200-20 MG/5ML PO SUSP: 30.0000 mL | Freq: Four times a day (QID) | ORAL | Status: DC | PRN

## 2023-01-08 MED ORDER — ACETAMINOPHEN 500 MG PO TABS
1000.0000 mg | ORAL_TABLET | Freq: Once | ORAL | Status: DC
  Filled 2023-01-08: qty 2

## 2023-01-08 MED ORDER — PROPOFOL 10 MG/ML IV BOLUS
INTRAVENOUS | Status: DC | PRN
  Administered 2023-01-08: 140 mg via INTRAVENOUS
  Administered 2023-01-08: 20 ug/kg/min via INTRAVENOUS

## 2023-01-08 MED ORDER — AMISULPRIDE (ANTIEMETIC) 5 MG/2ML IV SOLN
INTRAVENOUS | Status: AC
  Filled 2023-01-08: qty 2

## 2023-01-08 MED ORDER — PROPOFOL 10 MG/ML IV BOLUS
INTRAVENOUS | Status: AC
  Filled 2023-01-08: qty 20

## 2023-01-08 MED ORDER — CEFAZOLIN SODIUM-DEXTROSE 2-4 GM/100ML-% IV SOLN
2.0000 g | Freq: Three times a day (TID) | INTRAVENOUS | Status: AC
  Administered 2023-01-08 – 2023-01-09 (×2): 2 g via INTRAVENOUS
  Filled 2023-01-08 (×2): qty 100

## 2023-01-08 MED ORDER — BUPIVACAINE HCL (PF) 0.5 % IJ SOLN
INTRAMUSCULAR | Status: DC | PRN
  Administered 2023-01-08: 5 mL
  Administered 2023-01-08: 25 mL

## 2023-01-08 MED ORDER — FENTANYL CITRATE (PF) 250 MCG/5ML IJ SOLN
INTRAMUSCULAR | Status: AC
  Filled 2023-01-08: qty 5

## 2023-01-08 MED ORDER — THROMBIN 5000 UNITS EX SOLR
OROMUCOSAL | Status: DC | PRN
  Administered 2023-01-08 (×3): 5 mL via TOPICAL

## 2023-01-08 MED ORDER — POLYETHYLENE GLYCOL 3350 17 G PO PACK: 17.0000 g | PACK | Freq: Every day | ORAL | Status: DC | PRN

## 2023-01-08 MED ORDER — IRBESARTAN 150 MG PO TABS
300.0000 mg | ORAL_TABLET | Freq: Every day | ORAL | Status: DC
  Administered 2023-01-08 – 2023-01-10 (×2): 300 mg via ORAL
  Filled 2023-01-08 (×2): qty 2

## 2023-01-08 MED ORDER — KETOROLAC TROMETHAMINE 15 MG/ML IJ SOLN
7.5000 mg | Freq: Four times a day (QID) | INTRAMUSCULAR | Status: AC
  Administered 2023-01-08 – 2023-01-09 (×4): 7.5 mg via INTRAVENOUS
  Filled 2023-01-08 (×4): qty 1

## 2023-01-08 MED ORDER — PHENYLEPHRINE HCL-NACL 20-0.9 MG/250ML-% IV SOLN
INTRAVENOUS | Status: DC | PRN
  Administered 2023-01-08: 30 ug/min via INTRAVENOUS

## 2023-01-08 MED ORDER — HYDROMORPHONE HCL 1 MG/ML IJ SOLN
INTRAMUSCULAR | Status: AC
  Filled 2023-01-08: qty 0.5

## 2023-01-08 MED ORDER — MAGNESIUM OXIDE -MG SUPPLEMENT 400 (240 MG) MG PO TABS
400.0000 mg | ORAL_TABLET | Freq: Every day | ORAL | Status: DC
  Administered 2023-01-08 – 2023-01-10 (×3): 400 mg via ORAL
  Filled 2023-01-08 (×3): qty 1

## 2023-01-08 MED ORDER — HYDROMORPHONE HCL 1 MG/ML IJ SOLN
INTRAMUSCULAR | Status: AC
  Filled 2023-01-08: qty 1

## 2023-01-08 MED ORDER — HYDROMORPHONE HCL 2 MG PO TABS
2.0000 mg | ORAL_TABLET | ORAL | Status: DC | PRN
  Administered 2023-01-08 – 2023-01-10 (×9): 2 mg via ORAL
  Filled 2023-01-08 (×10): qty 1

## 2023-01-08 MED ORDER — ALBUMIN HUMAN 5 % IV SOLN: INTRAVENOUS | Status: DC | PRN

## 2023-01-08 MED ORDER — PHENYLEPHRINE 80 MCG/ML (10ML) SYRINGE FOR IV PUSH (FOR BLOOD PRESSURE SUPPORT)
PREFILLED_SYRINGE | INTRAVENOUS | Status: AC
  Filled 2023-01-08: qty 10

## 2023-01-08 MED ORDER — TELMISARTAN-HCTZ 80-25 MG PO TABS: 1.0000 | ORAL_TABLET | Freq: Every day | ORAL | Status: DC

## 2023-01-08 MED ORDER — ONDANSETRON HCL 4 MG/2ML IJ SOLN
INTRAMUSCULAR | Status: DC | PRN
  Administered 2023-01-08: 4 mg via INTRAVENOUS

## 2023-01-08 MED ORDER — ACETAMINOPHEN ER 650 MG PO TBCR: 1300.0000 mg | EXTENDED_RELEASE_TABLET | Freq: Three times a day (TID) | ORAL | Status: DC | PRN

## 2023-01-08 MED ORDER — THROMBIN 5000 UNITS EX SOLR
CUTANEOUS | Status: AC
  Filled 2023-01-08: qty 5000

## 2023-01-08 MED ORDER — HYDROMORPHONE HCL 1 MG/ML IJ SOLN
INTRAMUSCULAR | Status: DC | PRN
  Administered 2023-01-08: .5 mg via INTRAVENOUS

## 2023-01-08 MED ORDER — ROCURONIUM BROMIDE 10 MG/ML (PF) SYRINGE
PREFILLED_SYRINGE | INTRAVENOUS | Status: AC
  Filled 2023-01-08: qty 10

## 2023-01-08 MED ORDER — LACTATED RINGERS IV SOLN: INTRAVENOUS | Status: DC

## 2023-01-08 MED ORDER — LIDOCAINE-EPINEPHRINE 1 %-1:100000 IJ SOLN
INTRAMUSCULAR | Status: AC
  Filled 2023-01-08: qty 1

## 2023-01-08 MED ORDER — SACCHAROMYCES BOULARDII 250 MG PO CAPS
250.0000 mg | ORAL_CAPSULE | Freq: Every day | ORAL | Status: DC
  Administered 2023-01-08 – 2023-01-10 (×3): 250 mg via ORAL
  Filled 2023-01-08 (×3): qty 1

## 2023-01-08 MED ORDER — HYDROMORPHONE HCL 1 MG/ML IJ SOLN: 1.0000 mg | INTRAMUSCULAR | Status: DC | PRN

## 2023-01-08 MED ORDER — CYCLOBENZAPRINE HCL 10 MG PO TABS
10.0000 mg | ORAL_TABLET | Freq: Every day | ORAL | Status: DC
  Administered 2023-01-08 – 2023-01-09 (×2): 10 mg via ORAL
  Filled 2023-01-08 (×2): qty 1

## 2023-01-08 MED ORDER — 0.9 % SODIUM CHLORIDE (POUR BTL) OPTIME
TOPICAL | Status: DC | PRN
  Administered 2023-01-08: 1000 mL

## 2023-01-08 MED ORDER — ACETAMINOPHEN 650 MG RE SUPP: 650.0000 mg | RECTAL | Status: DC | PRN

## 2023-01-08 MED ORDER — METHOCARBAMOL 1000 MG/10ML IJ SOLN: 500.0000 mg | Freq: Four times a day (QID) | INTRAVENOUS | Status: DC | PRN

## 2023-01-08 MED ORDER — ROSUVASTATIN CALCIUM 20 MG PO TABS
20.0000 mg | ORAL_TABLET | Freq: Every day | ORAL | Status: DC
  Administered 2023-01-09 – 2023-01-10 (×2): 20 mg via ORAL
  Filled 2023-01-08 (×2): qty 1

## 2023-01-08 MED ORDER — LORAZEPAM 0.5 MG PO TABS
0.5000 mg | ORAL_TABLET | Freq: Every day | ORAL | Status: DC
  Administered 2023-01-08 – 2023-01-09 (×2): 0.5 mg via ORAL
  Filled 2023-01-08 (×2): qty 1

## 2023-01-08 MED ORDER — MIDAZOLAM HCL 2 MG/2ML IJ SOLN
INTRAMUSCULAR | Status: AC
  Filled 2023-01-08: qty 2

## 2023-01-08 MED ORDER — ONDANSETRON HCL 4 MG/2ML IJ SOLN: 4.0000 mg | Freq: Four times a day (QID) | INTRAMUSCULAR | Status: DC | PRN

## 2023-01-08 MED ORDER — SCOPOLAMINE 1 MG/3DAYS TD PT72: 1.0000 | MEDICATED_PATCH | TRANSDERMAL | Status: DC

## 2023-01-08 MED ORDER — AMISULPRIDE (ANTIEMETIC) 5 MG/2ML IV SOLN
5.0000 mg | Freq: Once | INTRAVENOUS | Status: AC
  Administered 2023-01-08: 5 mg via INTRAVENOUS

## 2023-01-08 MED ORDER — CHLORHEXIDINE GLUCONATE CLOTH 2 % EX PADS: 6.0000 | MEDICATED_PAD | Freq: Once | CUTANEOUS | Status: DC

## 2023-01-08 MED ORDER — DOCUSATE SODIUM 100 MG PO CAPS
100.0000 mg | ORAL_CAPSULE | Freq: Two times a day (BID) | ORAL | Status: DC
  Administered 2023-01-08 – 2023-01-10 (×4): 100 mg via ORAL
  Filled 2023-01-08 (×4): qty 1

## 2023-01-08 MED ORDER — SUGAMMADEX SODIUM 200 MG/2ML IV SOLN
INTRAVENOUS | Status: DC | PRN
  Administered 2023-01-08: 300 mg via INTRAVENOUS

## 2023-01-08 MED ORDER — CHLORHEXIDINE GLUCONATE 0.12 % MT SOLN
15.0000 mL | Freq: Once | OROMUCOSAL | Status: AC
  Administered 2023-01-08: 15 mL via OROMUCOSAL
  Filled 2023-01-08: qty 15

## 2023-01-08 MED ORDER — CEFAZOLIN SODIUM 1 G IJ SOLR
INTRAMUSCULAR | Status: AC
  Filled 2023-01-08: qty 20

## 2023-01-08 MED ORDER — ONDANSETRON HCL 4 MG PO TABS: 4.0000 mg | ORAL_TABLET | Freq: Four times a day (QID) | ORAL | Status: DC | PRN

## 2023-01-08 MED ORDER — ACETAMINOPHEN 325 MG PO TABS
650.0000 mg | ORAL_TABLET | ORAL | Status: DC | PRN
  Administered 2023-01-08 – 2023-01-10 (×5): 650 mg via ORAL
  Filled 2023-01-08 (×5): qty 2

## 2023-01-08 MED ORDER — BISACODYL 10 MG RE SUPP: 10.0000 mg | Freq: Every day | RECTAL | Status: DC | PRN

## 2023-01-08 MED ORDER — SODIUM CHLORIDE 0.9% FLUSH
3.0000 mL | Freq: Two times a day (BID) | INTRAVENOUS | Status: DC
  Administered 2023-01-09 (×2): 3 mL via INTRAVENOUS

## 2023-01-08 MED ORDER — LIDOCAINE 2% (20 MG/ML) 5 ML SYRINGE
INTRAMUSCULAR | Status: AC
  Filled 2023-01-08: qty 5

## 2023-01-08 MED ORDER — HYDROMORPHONE HCL 1 MG/ML IJ SOLN
0.2500 mg | INTRAMUSCULAR | Status: DC | PRN
  Administered 2023-01-08 (×3): 0.25 mg via INTRAVENOUS

## 2023-01-08 MED ORDER — SENNA 8.6 MG PO TABS
1.0000 | ORAL_TABLET | Freq: Two times a day (BID) | ORAL | Status: DC
  Administered 2023-01-08 – 2023-01-10 (×4): 8.6 mg via ORAL
  Filled 2023-01-08 (×4): qty 1

## 2023-01-08 MED ORDER — DEXAMETHASONE SODIUM PHOSPHATE 10 MG/ML IJ SOLN
INTRAMUSCULAR | Status: DC | PRN
  Administered 2023-01-08: 10 mg via INTRAVENOUS

## 2023-01-08 MED ORDER — PHENYLEPHRINE 80 MCG/ML (10ML) SYRINGE FOR IV PUSH (FOR BLOOD PRESSURE SUPPORT)
PREFILLED_SYRINGE | INTRAVENOUS | Status: DC | PRN
  Administered 2023-01-08 (×6): 80 ug via INTRAVENOUS

## 2023-01-08 MED ORDER — DEXAMETHASONE SODIUM PHOSPHATE 10 MG/ML IJ SOLN
INTRAMUSCULAR | Status: AC
  Filled 2023-01-08: qty 1

## 2023-01-08 MED ORDER — HYDROCHLOROTHIAZIDE 25 MG PO TABS
25.0000 mg | ORAL_TABLET | Freq: Every day | ORAL | Status: DC
  Administered 2023-01-08 – 2023-01-10 (×2): 25 mg via ORAL
  Filled 2023-01-08 (×3): qty 1

## 2023-01-08 MED ORDER — MIDAZOLAM HCL 2 MG/2ML IJ SOLN
INTRAMUSCULAR | Status: DC | PRN
  Administered 2023-01-08: 2 mg via INTRAVENOUS

## 2023-01-08 MED ORDER — SCOPOLAMINE 1 MG/3DAYS TD PT72
MEDICATED_PATCH | TRANSDERMAL | Status: AC
  Administered 2023-01-08: 1.5 mg via TRANSDERMAL
  Filled 2023-01-08: qty 1

## 2023-01-08 MED ORDER — METHOCARBAMOL 500 MG PO TABS
500.0000 mg | ORAL_TABLET | Freq: Four times a day (QID) | ORAL | Status: DC | PRN
  Administered 2023-01-08 – 2023-01-10 (×5): 500 mg via ORAL
  Filled 2023-01-08 (×5): qty 1

## 2023-01-08 MED ORDER — MENTHOL 3 MG MT LOZG: 1.0000 | LOZENGE | OROMUCOSAL | Status: DC | PRN

## 2023-01-08 MED ORDER — FLEET ENEMA 7-19 GM/118ML RE ENEM: 1.0000 | ENEMA | Freq: Once | RECTAL | Status: DC | PRN

## 2023-01-08 MED ORDER — LORATADINE 10 MG PO TABS
10.0000 mg | ORAL_TABLET | Freq: Every day | ORAL | Status: DC
  Administered 2023-01-09 – 2023-01-10 (×2): 10 mg via ORAL
  Filled 2023-01-08 (×2): qty 1

## 2023-01-08 MED ORDER — SODIUM CHLORIDE 0.9 % IV SOLN: 250.0000 mL | INTRAVENOUS | Status: DC

## 2023-01-08 MED ORDER — PHENOL 1.4 % MT LIQD: 1.0000 | OROMUCOSAL | Status: DC | PRN

## 2023-01-08 MED ORDER — SODIUM CHLORIDE 0.9 % IV SOLN: INTRAVENOUS | Status: DC | PRN

## 2023-01-08 MED ORDER — ORAL CARE MOUTH RINSE: 15.0000 mL | Freq: Once | OROMUCOSAL | Status: AC

## 2023-01-08 MED ORDER — ROCURONIUM BROMIDE 10 MG/ML (PF) SYRINGE
PREFILLED_SYRINGE | INTRAVENOUS | Status: DC | PRN
  Administered 2023-01-08: 10 mg via INTRAVENOUS
  Administered 2023-01-08: 20 mg via INTRAVENOUS
  Administered 2023-01-08: 10 mg via INTRAVENOUS
  Administered 2023-01-08: 20 mg via INTRAVENOUS
  Administered 2023-01-08: 10 mg via INTRAVENOUS
  Administered 2023-01-08: 70 mg via INTRAVENOUS
  Administered 2023-01-08: 10 mg via INTRAVENOUS

## 2023-01-08 MED ORDER — SODIUM CHLORIDE 0.9% FLUSH: 3.0000 mL | INTRAVENOUS | Status: DC | PRN

## 2023-01-08 MED ORDER — PANTOPRAZOLE SODIUM 40 MG PO TBEC
40.0000 mg | DELAYED_RELEASE_TABLET | Freq: Every day | ORAL | Status: DC
  Administered 2023-01-09 – 2023-01-10 (×2): 40 mg via ORAL
  Filled 2023-01-08 (×2): qty 1

## 2023-01-08 MED ORDER — ACETAMINOPHEN 10 MG/ML IV SOLN
INTRAVENOUS | Status: DC | PRN
  Administered 2023-01-08: 1000 mg via INTRAVENOUS

## 2023-01-08 MED ORDER — ONDANSETRON HCL 4 MG/2ML IJ SOLN
INTRAMUSCULAR | Status: AC
  Filled 2023-01-08: qty 2

## 2023-01-08 MED ORDER — FENTANYL CITRATE (PF) 250 MCG/5ML IJ SOLN
INTRAMUSCULAR | Status: DC | PRN
  Administered 2023-01-08: 50 ug via INTRAVENOUS
  Administered 2023-01-08: 100 ug via INTRAVENOUS
  Administered 2023-01-08 (×3): 50 ug via INTRAVENOUS

## 2023-01-08 MED ORDER — CEFAZOLIN SODIUM-DEXTROSE 2-4 GM/100ML-% IV SOLN
2.0000 g | INTRAVENOUS | Status: AC
  Administered 2023-01-08 (×2): 2 g via INTRAVENOUS
  Filled 2023-01-08: qty 100

## 2023-01-08 MED ORDER — LIDOCAINE 2% (20 MG/ML) 5 ML SYRINGE
INTRAMUSCULAR | Status: DC | PRN
  Administered 2023-01-08: 80 mg via INTRAVENOUS

## 2023-01-08 MED ORDER — BUPIVACAINE HCL (PF) 0.5 % IJ SOLN
INTRAMUSCULAR | Status: AC
  Filled 2023-01-08: qty 30

## 2023-01-08 SURGICAL SUPPLY — 76 items
ADH SKN CLS APL DERMABOND .7 (GAUZE/BANDAGES/DRESSINGS) ×1
ADH SKN CLS LQ APL DERMABOND (GAUZE/BANDAGES/DRESSINGS) ×1
APL SRG 60D 8 XTD TIP BNDBL (TIP)
BAG COUNTER SPONGE SURGICOUNT (BAG) ×1 IMPLANT
BAG SPNG CNTER NS LX DISP (BAG) ×2
BASKET BONE COLLECTION (BASKET) ×1 IMPLANT
BLADE BONE MILL FINE (MISCELLANEOUS) ×1
BLADE BONE MILL MEDIUM (MISCELLANEOUS) ×1 IMPLANT
BLADE CLIPPER SURG (BLADE) IMPLANT
BLADE MILL BN FN STRL DISP (MISCELLANEOUS) IMPLANT
BONE CANC CHIPS 20CC PCAN1/4 (Bone Implant) ×1 IMPLANT
BUR MATCHSTICK NEURO 3.0 LAGG (BURR) ×1 IMPLANT
CAGE COROENT 12X9X23-8 (Cage) IMPLANT
CAGE COROENT LG 10X9X23-12 (Cage) IMPLANT
CANISTER SUCT 3000ML PPV (MISCELLANEOUS) ×1 IMPLANT
CHIPS CANC BONE 20CC PCAN1/4 (Bone Implant) ×1 IMPLANT
CNTNR URN SCR LID CUP LEK RST (MISCELLANEOUS) ×1 IMPLANT
CONT SPEC 4OZ STRL OR WHT (MISCELLANEOUS) ×1
COVER BACK TABLE 60X90IN (DRAPES) ×1 IMPLANT
DERMABOND ADVANCED .7 DNX12 (GAUZE/BANDAGES/DRESSINGS) ×1 IMPLANT
DERMABOND ADVANCED .7 DNX6 (GAUZE/BANDAGES/DRESSINGS) IMPLANT
DEVICE DISSECT PLASMABLAD 3.0S (MISCELLANEOUS) ×1 IMPLANT
DRAPE C-ARM 42X72 X-RAY (DRAPES) ×2 IMPLANT
DRAPE C-ARMOR (DRAPES) IMPLANT
DRAPE HALF SHEET 40X57 (DRAPES) IMPLANT
DRAPE LAPAROTOMY 100X72X124 (DRAPES) ×1 IMPLANT
DRSG OPSITE POSTOP 4X6 (GAUZE/BANDAGES/DRESSINGS) IMPLANT
DURAPREP 26ML APPLICATOR (WOUND CARE) ×1 IMPLANT
DURASEAL APPLICATOR TIP (TIP) IMPLANT
DURASEAL SPINE SEALANT 3ML (MISCELLANEOUS) IMPLANT
ELECT REM PT RETURN 9FT ADLT (ELECTROSURGICAL) ×1
ELECTRODE REM PT RTRN 9FT ADLT (ELECTROSURGICAL) ×1 IMPLANT
GAUZE 4X4 16PLY ~~LOC~~+RFID DBL (SPONGE) IMPLANT
GAUZE SPONGE 4X4 12PLY STRL (GAUZE/BANDAGES/DRESSINGS) ×1 IMPLANT
GLOVE BIOGEL PI IND STRL 8.5 (GLOVE) ×2 IMPLANT
GLOVE ECLIPSE 8.5 STRL (GLOVE) ×2 IMPLANT
GOWN STRL REUS W/ TWL LRG LVL3 (GOWN DISPOSABLE) IMPLANT
GOWN STRL REUS W/ TWL XL LVL3 (GOWN DISPOSABLE) IMPLANT
GOWN STRL REUS W/TWL 2XL LVL3 (GOWN DISPOSABLE) ×2 IMPLANT
GOWN STRL REUS W/TWL LRG LVL3 (GOWN DISPOSABLE) ×4
GOWN STRL REUS W/TWL XL LVL3 (GOWN DISPOSABLE)
GRAFT BNE CANC CHIPS 1-8 20CC (Bone Implant) IMPLANT
GRAFT BONE PROTEIOS XL 10CC (Orthopedic Implant) IMPLANT
HEMOSTAT POWDER KIT SURGIFOAM (HEMOSTASIS) ×1 IMPLANT
KIT BASIN OR (CUSTOM PROCEDURE TRAY) ×1 IMPLANT
KIT GRAFTMAG DEL NEURO DISP (NEUROSURGERY SUPPLIES) IMPLANT
KIT TURNOVER KIT B (KITS) ×1 IMPLANT
MILL BONE PREP (MISCELLANEOUS) ×1 IMPLANT
NDL SPNL 18GX3.5 QUINCKE PK (NEEDLE) IMPLANT
NEEDLE HYPO 22GX1.5 SAFETY (NEEDLE) ×1 IMPLANT
NEEDLE SPNL 18GX3.5 QUINCKE PK (NEEDLE) ×1 IMPLANT
NS IRRIG 1000ML POUR BTL (IV SOLUTION) ×1 IMPLANT
PACK LAMINECTOMY NEURO (CUSTOM PROCEDURE TRAY) ×1 IMPLANT
PAD ARMBOARD 7.5X6 YLW CONV (MISCELLANEOUS) ×3 IMPLANT
PATTIES SURGICAL .5 X1 (DISPOSABLE) ×1 IMPLANT
PLASMABLADE 3.0S (MISCELLANEOUS) ×1
ROD RELIN-O LORD 5.5X65MM (Rod) IMPLANT
ROD RELINE TI LORD 5.5X70 (Rod) IMPLANT
SCREW LOCK RELINE 5.5 TULIP (Screw) IMPLANT
SCREW RELINE-O POLY 6.5X45 (Screw) IMPLANT
SPIKE FLUID TRANSFER (MISCELLANEOUS) ×1 IMPLANT
SPONGE SURGIFOAM ABS GEL 100 (HEMOSTASIS) IMPLANT
SPONGE T-LAP 4X18 ~~LOC~~+RFID (SPONGE) IMPLANT
SUT PROLENE 6 0 BV (SUTURE) IMPLANT
SUT VIC AB 1 CT1 18XBRD ANBCTR (SUTURE) ×1 IMPLANT
SUT VIC AB 1 CT1 8-18 (SUTURE) ×2
SUT VIC AB 2-0 CP2 18 (SUTURE) ×1 IMPLANT
SUT VIC AB 3-0 SH 8-18 (SUTURE) ×1 IMPLANT
SUT VIC AB 4-0 RB1 18 (SUTURE) ×1 IMPLANT
SYR 3ML LL SCALE MARK (SYRINGE) ×4 IMPLANT
SYR 5ML LL (SYRINGE) IMPLANT
TAPE CLOTH SURG 4X10 WHT LF (GAUZE/BANDAGES/DRESSINGS) IMPLANT
TOWEL GREEN STERILE (TOWEL DISPOSABLE) ×1 IMPLANT
TOWEL GREEN STERILE FF (TOWEL DISPOSABLE) ×1 IMPLANT
TRAY FOLEY MTR SLVR 16FR STAT (SET/KITS/TRAYS/PACK) ×1 IMPLANT
WATER STERILE IRR 1000ML POUR (IV SOLUTION) ×1 IMPLANT

## 2023-01-08 NOTE — Anesthesia Postprocedure Evaluation (Signed)
Anesthesia Post Note  Patient: Blake Cox  Procedure(s) Performed: Lumbar Three-Four , Lumbar Four- Five Posterior lumbar interbody fusion (Back)     Patient location during evaluation: PACU Anesthesia Type: General Level of consciousness: awake and alert Pain management: pain level controlled Vital Signs Assessment: post-procedure vital signs reviewed and stable Respiratory status: spontaneous breathing, nonlabored ventilation, respiratory function stable and patient connected to nasal cannula oxygen Cardiovascular status: blood pressure returned to baseline and stable Postop Assessment: no apparent nausea or vomiting Anesthetic complications: no  There were no known notable events for this encounter.  Last Vitals:  Vitals:   01/08/23 1500 01/08/23 1515  BP: 93/67 99/64  Pulse: 82 77  Resp: 17 14  Temp:    SpO2: 94% 98%    Last Pain:  Vitals:   01/08/23 1515  PainSc: Asleep                 Tally Mckinnon,W. EDMOND

## 2023-01-08 NOTE — Transfer of Care (Signed)
Immediate Anesthesia Transfer of Care Note  Patient: Blake Cox  Procedure(s) Performed: Lumbar Three-Four , Lumbar Four- Five Posterior lumbar interbody fusion (Back)  Patient Location: PACU  Anesthesia Type:General  Level of Consciousness: drowsy and patient cooperative  Airway & Oxygen Therapy: Patient connected to face mask oxygen  Post-op Assessment: Report given to RN and Post -op Vital signs reviewed and stable  Post vital signs: Reviewed and stable  Last Vitals:  Vitals Value Taken Time  BP 84/57 01/08/23 1403  Temp    Pulse 87 01/08/23 1404  Resp 21 01/08/23 1404  SpO2 93 % 01/08/23 1404  Vitals shown include unvalidated device data.  Last Pain:  Vitals:   01/08/23 0655  PainSc: 2       Patients Stated Pain Goal: 2 ([id number])  Complications: There were no known notable events for this encounter.

## 2023-01-08 NOTE — Anesthesia Procedure Notes (Signed)
Procedure Name: Intubation Date/Time: 01/08/2023 7:54 AM  Performed by: Ester Rink, CRNAPre-anesthesia Checklist: Patient identified, Emergency Drugs available, Suction available and Patient being monitored Patient Re-evaluated:Patient Re-evaluated prior to induction Oxygen Delivery Method: Circle system utilized Preoxygenation: Pre-oxygenation with 100% oxygen Induction Type: IV induction Ventilation: Mask ventilation without difficulty and Oral airway inserted - appropriate to patient size Laryngoscope Size: Mac and 4 Grade View: Grade I Tube type: Oral Tube size: 7.5 mm Number of attempts: 1 Airway Equipment and Method: Stylet and Oral airway Placement Confirmation: ETT inserted through vocal cords under direct vision, positive ETCO2 and breath sounds checked- equal and bilateral Secured at: 23 cm Tube secured with: Tape Dental Injury: Teeth and Oropharynx as per pre-operative assessment

## 2023-01-08 NOTE — Progress Notes (Signed)
Orthopedic Tech Progress Note Patient Details:  GERRAD POST Aug 17, 1951 [id number]  Ortho Devices Type of Ortho Device: Lumbar corsett Ortho Device/Splint Interventions: Ordered      Danton Sewer A Kealey Kemmer 01/08/2023, 5:55 PM

## 2023-01-08 NOTE — H&P (Signed)
Blake Cox is an 72 y.o. male.   Chief Complaint: Back and leg pain HPI: Blake Cox is a 72 year old individual who was at significant lumbar spinal stenosis at L3-4 and L4-5.  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  He underwent a decompressive laminotomies at L3-4 and L4-5 about 6 months ago.  He was initially felt that he was getting better but then he subsequently noticed that he had worsening back pain and leg pain and decreased stamina on his feet is noted that his spondylolisthesis and indeed started to move he is now exhibiting signs of recurrent lumbar radiculopathy in addition to neurogenic claudication.  He has been advised regarding the need to stabilize L3-4 and L4-5 with posterior interbody technique.  Past Medical History:  Diagnosis Date   Borderline high cholesterol    Chronic kidney disease    per PCP note- pt unsure   Complication of anesthesia    "hard to come out of anesthesia"   Headache    migraines- worse in the past   Hypertension    Pre-diabetes     Past Surgical History:  Procedure Laterality Date   BACK SURGERY     Dr Ellene Route october 2023   CARPAL TUNNEL RELEASE Right    CERVICAL SPINE SURGERY     Dr Ellene Route   dental implants     INJECTION KNEE Right     No family history on file. Social History:  reports that he has never smoked. He has never used smokeless tobacco. He reports that he does not drink alcohol and does not use drugs.  Allergies:  Allergies  Allergen Reactions   Toprol Xl [Metoprolol] Rash    Medications Prior to Admission  Medication Sig Dispense Refill   acetaminophen (TYLENOL) 650 MG CR tablet Take 1,300 mg by mouth every 8 (eight) hours as needed for pain.     Ascorbic Acid (VITAMIN C) 1000 MG tablet Take 1,000 mg by mouth daily.     cetirizine (ZYRTEC) 10 MG tablet Take 10 mg by mouth daily.     Cholecalciferol (DIALYVITE VITAMIN D 5000) 125 MCG (5000 UT) capsule Take 5,000 Units by  mouth daily.     cyclobenzaprine (FLEXERIL) 10 MG tablet Take 10 mg by mouth at bedtime.     DEXILANT 60 MG capsule Take 60 mg by mouth daily.     ibuprofen (ADVIL) 800 MG tablet Take 800 mg by mouth 3 (three) times daily as needed for moderate pain.     LORazepam (ATIVAN) 0.5 MG tablet Take 0.5 mg by mouth at bedtime.     Magnesium 400 MG TABS Take 400 mg by mouth daily.     Probiotic, Lactobacillus, CAPS Take 1 capsule by mouth daily.     rosuvastatin (CRESTOR) 20 MG tablet Take 20 mg by mouth daily.     sildenafil (REVATIO) 20 MG tablet Take 40-60 mg by mouth daily as needed (ED).     telmisartan-hydrochlorothiazide (MICARDIS HCT) 80-25 MG tablet Take 1 tablet by mouth daily.     Zinc 50 MG TABS Take 50 mg by mouth daily.     promethazine (PHENERGAN) 25 MG tablet Take 25 mg by mouth every 6 (six) hours as needed for vomiting or nausea.      No results found for this or any previous visit (from the past 48 hour(s)). No results found.  Review of Systems  Constitutional:  Positive for activity change.  Musculoskeletal:  Positive for  back pain, gait problem and myalgias.  Neurological:  Positive for weakness and numbness.  All other systems reviewed and are negative.   Blood pressure 126/86, pulse 63, temperature 98.4 F (36.9 C), resp. rate 20, height 5' 10"$  (1.778 m), weight 83.9 kg, SpO2 96 %. Physical Exam Constitutional:      Appearance: Normal appearance. He is normal weight.  HENT:     Head: Normocephalic and atraumatic.     Right Ear: Tympanic membrane, ear canal and external ear normal.     Left Ear: Tympanic membrane, ear canal and external ear normal.     Nose: Nose normal.     Mouth/Throat:     Mouth: Mucous membranes are dry.     Pharynx: Oropharynx is clear.  Eyes:     Extraocular Movements: Extraocular movements intact.     Conjunctiva/sclera: Conjunctivae normal.     Pupils: Pupils are equal, round, and reactive to light.  Cardiovascular:     Rate and Rhythm:  Normal rate and regular rhythm.  Pulmonary:     Effort: Pulmonary effort is normal.     Breath sounds: Normal breath sounds.  Abdominal:     General: Abdomen is flat. Bowel sounds are normal.     Palpations: Abdomen is soft.  Musculoskeletal:     Cervical back: Normal range of motion and neck supple.     Comments: Positive straight leg raising at 30 degrees Patrick's maneuver is negative bilaterally  Skin:    General: Skin is warm and dry.     Capillary Refill: Capillary refill takes less than 2 seconds.  Neurological:     General: No focal deficit present.     Mental Status: He is alert.     Comments: Mild tibialis anterior weakness  Psychiatric:        Mood and Affect: Mood normal.        Behavior: Behavior normal.        Thought Content: Thought content normal.        Judgment: Judgment normal.      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  Plan posterior decompression L3-4 and L4-5 with posterior interbody technique pedicle screw fixation L3-L5.  Earleen Newport, MD 01/08/2023, 7:35 AM

## 2023-01-08 NOTE — Op Note (Signed)
**Note Blake-Identified via Obfuscation** Date of surgery: 01/08/2023 Preoperative diagnosis: Stenosis L3-4 L4-5 with degenerative scoliosis of the lumbar spine.  Status post decompression via laminotomies.  Lumbar radiculopathy.  Lumbar stenosis. Postoperative diagnosis: Same Procedure: Decompression L3-4 and L4-5 with complete laminectomy of L4 partial laminectomy of L3 decompression of the L3 the L4 and L5 nerve roots.  More work than required for simple interbody technique.  Posterior lumbar interbody arthrodesis with peek spacers local autograft and allograft L3-4 and L4-5 arthrodesis with local autograft allograft and Prody os.  Posterolateral arthrodesis with local autograft allograft and Prody os.  Pedicle screw fixation L3-L5 with fluoroscopic guidance. Surgeon: Blake Cox First Assistant: Dr. Consuella Lose Anesthesia: General endotracheal Indications: Blake Cox is a 72 year old individual who has had previous surgery to decompress L3-4 and L4-5.  He is developing worsening stenosis and a slight spondylolisthesis with a lateral listhesis of L4 and L5.  He has recurrent pain in the back and the legs because he has a degenerative scoliosis that appears to be worsening he has been advised regarding surgery to decompress and stabilize L3-L5.  Procedure: The patient was brought to the operating room supine on the stretcher.  After the smooth induction of general endotracheal anesthesia and placement of a Foley catheter he was carefully turned prone.  The back was prepped with alcohol DuraPrep and draped in a sterile fashion.  Midline incision was created and carried down to the lumbodorsal fascia.  Fascia on either side midline was opened and a radiograph was used to localize the L3-4 and L4-5 disc spaces once these were verified and the dissection was carried out laterally to expose the transverse process of L3-L4 and L5 on either side lateral gutters were packed off for later use and grafting.  Then the previously noted laminectomies  were carefully explored and laminectomies were carried to completion.  At L3 remove the superior portion of the lamina along with the entirety of the facet at L3-4.  The scar tissue in this area was released and care was taken to protect the dura the takeoff of the L3 nerve root superiorly was well decompressed on both sides care was taken to preserve the integrity of the spinal tube.  Dissection was then carried inferiorly and a complete laminectomy of L4 was performed.  This allowed for good decompression of the L4 nerve root superiorly, dural tube and the L5 nerve root inferiorly.  The disc spaces at each level were then isolated.  Bone that was harvested was used for grafting along with 20 cc of article cancellous chips which was mixed with 10 cc of Prody os.  Once the disc spaces were isolated complete discectomy was carried out at L3-4 and L4-5.  Interbody's base was then sized for an appropriate sized spacer and it was felt that a 10 x 9 x 23 mm spacer with 12 degrees of lordosis would fit best at the L4-L5 level 2 of the spacers were placed into the interspace after being filled with bone graft and an additional 12 cc of bone graft was placed into the interspace also with the spacers.  At L3-L4 and the interbody space was noted to be somewhat larger and a 12 x 9 x 23 mm spacer could be placed into this interspace with 8 degrees lordosis this was then packed also with a total of 16 cc of bone graft.  Once the spaces were packed and the integrity of the common dural tube and nerve roots was checked fluoroscopic guidance was used to place  pedicle screws in L3-L4 and L5.  6 pedicle screws measuring 6.5 x 45 mm screws were placed under direct or scopic visualization then a 5 mm precontoured rod was placed on the right side between the screw heads and a 70 mm precontoured rod was placed on the left side these were tightened in a neutral construct.  Lateral gutters were then packed with an additional 6 cc of bone  graft on each side.  Once this was completed hemostasis was carefully checked in the common dural tube and the main portion of the incision and then the lumbodorsal fascia was closed with #1 Vicryl in interrupted fashion 25 cc of half percent Marcaine was injected into the paraspinous fascia.  2-0 and 3-0 Vicryl were used to close the subcutaneous and subcuticular layers respectively.  Blood loss for the procedure was estimated at about 650 cc 220 cc of Cell Saver blood was returned to the patient.  No spinal fluid leaks occurred.  Patient tolerated procedure well was returned to recovery room in stable condition.

## 2023-01-09 LAB — CBC
HCT: 35.1 % — ABNORMAL LOW (ref 39.0–52.0)
Hemoglobin: 12.2 g/dL — ABNORMAL LOW (ref 13.0–17.0)
MCH: 32.7 pg (ref 26.0–34.0)
MCHC: 34.8 g/dL (ref 30.0–36.0)
MCV: 94.1 fL (ref 80.0–100.0)
Platelets: 211 10*3/uL (ref 150–400)
RBC: 3.73 MIL/uL — ABNORMAL LOW (ref 4.22–5.81)
RDW: 13 % (ref 11.5–15.5)
WBC: 15.3 10*3/uL — ABNORMAL HIGH (ref 4.0–10.5)
nRBC: 0 % (ref 0.0–0.2)

## 2023-01-09 LAB — BASIC METABOLIC PANEL
Anion gap: 11 (ref 5–15)
BUN: 21 mg/dL (ref 8–23)
CO2: 23 mmol/L (ref 22–32)
Calcium: 8.3 mg/dL — ABNORMAL LOW (ref 8.9–10.3)
Chloride: 101 mmol/L (ref 98–111)
Creatinine, Ser: 1.42 mg/dL — ABNORMAL HIGH (ref 0.61–1.24)
GFR, Estimated: 53 mL/min — ABNORMAL LOW (ref >60)
Glucose, Bld: 115 mg/dL — ABNORMAL HIGH (ref 70–99)
Potassium: 4.3 mmol/L (ref 3.5–5.1)
Sodium: 135 mmol/L (ref 135–145)

## 2023-01-09 MED FILL — Thrombin For Soln 5000 Unit: CUTANEOUS | Qty: 5000 | Status: AC

## 2023-01-09 NOTE — Evaluation (Signed)
Physical Therapy Evaluation  Patient Details Name: Blake Cox MRN: [id number] DOB: [birth year] Today's Date: 01/09/2023  History of Present Illness  Pt is a 72 y/o male who presents s/p L3-L5 PLIF on 01/08/2023. PMH significant for CKD, HTN, pre-diabetes, R knee unjection, prior back surgery 08/2022, cervical spine surgery.   Clinical Impression  Pt admitted with above diagnosis. At the time of PT eval, pt was able to demonstrate transfers and ambulation with gross supervision for safety and RW for support. Pt reports dizziness when sitting EOB. BP stable throughout treatment session with no drop after position changes or ambulation in hall. Orthostatics negative. Pt reports lines in floor appear distorted, and attributes this and "dizziness" to pain meds. Pt was educated on precautions, brace application/wearing schedule, appropriate activity progression, and car transfer. Pt currently with functional limitations due to the deficits listed below (see PT Problem List). Pt will benefit from skilled PT to increase their independence and safety with mobility to allow discharge to the venue listed below.         Recommendations for follow up therapy are one component of a multi-disciplinary discharge planning process, led by the attending physician.  Recommendations may be updated based on patient status, additional functional criteria and insurance authorization.  Follow Up Recommendations No PT follow up      Assistance Recommended at Discharge PRN  Patient can return home with the following  A little help with walking and/or transfers;A little help with bathing/dressing/bathroom;Assistance with cooking/housework;Assist for transportation;Help with stairs or ramp for entrance    Equipment Recommendations None recommended by PT  Recommendations for Other Services       Functional Status Assessment Patient has had a recent decline in their functional status and demonstrates the ability to  make significant improvements in function in a reasonable and predictable amount of time.     Precautions / Restrictions Precautions Precautions: Fall;Back Precaution Booklet Issued: Yes (comment) Precaution Comments: Reviewed handout and pt was cued for precautions during functional mobility. Required Braces or Orthoses: Spinal Brace Spinal Brace: Lumbar corset;Applied in sitting position Restrictions Weight Bearing Restrictions: No      Mobility  Bed Mobility               General bed mobility comments: Pt was received sitting EOB and wanted to continue sitting up at end of session. Verbally reviewed log roll technique.    Transfers Overall transfer level: Needs assistance Equipment used: Rolling walker (2 wheels) Transfers: Sit to/from Stand Sit to Stand: Supervision           General transfer comment: VC's for improved posture and hand placement on seated surface for safety.    Ambulation/Gait Ambulation/Gait assistance: Supervision Gait Distance (Feet): 560 Feet Assistive device: Rolling walker (2 wheels) Gait Pattern/deviations: Step-through pattern, Decreased stride length, Trunk flexed, Narrow base of support Gait velocity: Decreased Gait velocity interpretation: 1.31 - 2.62 ft/sec, indicative of limited community ambulator   General Gait Details: VC's for improved posture, closer walker proximity, and forward gaze. No assist required and no overt LOB noted.  Stairs            Wheelchair Mobility    Modified Rankin (Stroke Patients Only)       Balance Overall balance assessment: Needs assistance Sitting-balance support: No upper extremity supported, Feet supported Sitting balance-Leahy Scale: Fair     Standing balance support: Bilateral upper extremity supported, During functional activity, Reliant on assistive device for balance Standing balance-Leahy Scale: Poor Standing balance comment:  Reliant on RW for dynamic activity.                              Pertinent Vitals/Pain Pain Assessment Pain Assessment: Faces Faces Pain Scale: Hurts a little bit Pain Location: Incision site Pain Descriptors / Indicators: Operative site guarding, Sore Pain Intervention(s): Limited activity within patient's tolerance, Monitored during session, Repositioned    Home Living Family/patient expects to be discharged to:: Private residence Living Arrangements: Spouse/significant other Available Help at Discharge: Family;Available 24 hours/day Type of Home: House Home Access: Stairs to enter   [employer] of Steps: 1   Home Layout: One level Home Equipment: Conservation officer, nature (2 wheels);Shower seat;Grab bars - tub/shower      Prior Function Prior Level of Function : Independent/Modified Independent                     Hand Dominance        Extremity/Trunk Assessment   Upper Extremity Assessment Upper Extremity Assessment: Overall WFL for tasks assessed    Lower Extremity Assessment Lower Extremity Assessment: Generalized weakness (Mild; consistent with pre-op diagnosis)    Cervical / Trunk Assessment Cervical / Trunk Assessment: Back Surgery  Communication   Communication: No difficulties  Cognition Arousal/Alertness: Awake/alert Behavior During Therapy: WFL for tasks assessed/performed Overall Cognitive Status: Within Functional Limits for tasks assessed                                          General Comments      Exercises     Assessment/Plan    PT Assessment Patient needs continued PT services  PT Problem List Decreased strength;Decreased range of motion;Decreased activity tolerance;Decreased balance;Decreased mobility;Decreased knowledge of use of DME;Decreased safety awareness;Decreased knowledge of precautions;Pain       PT Treatment Interventions DME instruction;Gait training;Stair training;Functional mobility training;Therapeutic activities;Therapeutic  exercise;Balance training;Patient/family education    PT Goals (Current goals can be found in the Care Plan section)  Acute Rehab PT Goals Patient Stated Goal: Home today PT Goal Formulation: With patient/family Time For Goal Achievement: 01/16/23 Potential to Achieve Goals: Good    Frequency Min 5X/week     Co-evaluation               AM-PAC PT "6 Clicks" Mobility  Outcome Measure Help needed turning from your back to your side while in a flat bed without using bedrails?: A Little Help needed moving from lying on your back to sitting on the side of a flat bed without using bedrails?: A Little Help needed moving to and from a bed to a chair (including a wheelchair)?: A Little Help needed standing up from a chair using your arms (e.g., wheelchair or bedside chair)?: A Little Help needed to walk in hospital room?: A Little Help needed climbing 3-5 steps with a railing? : A Little 6 Click Score: 18    End of Session Equipment Utilized During Treatment: Gait belt;Back brace Activity Tolerance: Patient tolerated treatment well Patient left: with call bell/phone within reach;with family/visitor present (Sitting EOB) Nurse Communication: Mobility status PT Visit Diagnosis: Unsteadiness on feet (R26.81);Pain Pain - part of body:  (back)    Time: 1001-1025 PT Time Calculation (min) (ACUTE ONLY): 24 min   Charges:   PT Evaluation $PT Eval Low Complexity: 1 Low PT Treatments $Gait Training: 8-22 mins  Rolinda Roan, PT, DPT Acute Rehabilitation Services Secure Chat Preferred Office: [phone]   Thelma Comp 01/09/2023, 11:13 AM

## 2023-01-09 NOTE — Progress Notes (Signed)
Patient ID: Blake Cox, male   DOB: [birth year], 72 y.o.   MRN: [id number] Vital signs are stable.  Patient's blood pressure has been a little soft.  Postoperative hemoglobin is good.  Labs are otherwise stable.  Legs are feeling better.  See how he mobilizes today.  Continues fluid support.  Plan discharge for tomorrow

## 2023-01-10 MED ORDER — METHOCARBAMOL 500 MG PO TABS: 500.0000 mg | ORAL_TABLET | Freq: Four times a day (QID) | ORAL | 3 refills | Status: DC | PRN

## 2023-01-10 MED ORDER — CEPHALEXIN 500 MG PO CAPS: 500.0000 mg | ORAL_CAPSULE | Freq: Three times a day (TID) | ORAL | 0 refills | Status: AC

## 2023-01-10 MED ORDER — HYDROMORPHONE HCL 2 MG PO TABS: 2.0000 mg | ORAL_TABLET | ORAL | 0 refills | Status: DC | PRN

## 2023-01-10 NOTE — Discharge Summary (Signed)
Physician Discharge Summary  Patient ID: Blake Cox MRN: [id number] DOB/AGE: 72-Jun-1952 72 y.o.  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  Lumbar stenosis, lumbar radiculopathy.  Discharge Diagnoses: Lumbar stenosis L3-4 L4-5.  Neurogenic claudication lumbar radiculopathy. Principal Problem:   Degenerative lumbar spinal stenosis   Discharged Condition: good  Hospital Course: Patient was admitted to undergo surgical decompression and stabilization from L3-L5 which she tolerated well.  Consults: None  Significant Diagnostic Studies: None  Treatments: surgery: See op note  Discharge Exam: Blood pressure 123/82, pulse (!) 111, temperature 98 F (36.7 C), temperature source Oral, resp. rate 18, height 5' 10"$  (1.778 m), weight 83.9 kg, SpO2 98 %. Station and gait are intact motor function is intact incision is clean and dry.  Disposition: Discharge disposition: 01-Home or Self Care       Discharge Instructions     Call MD for:  redness, tenderness, or signs of infection (pain, swelling, redness, odor or green/yellow discharge around incision site)   Complete by: As directed    Call MD for:  severe uncontrolled pain   Complete by: As directed    Call MD for:  temperature >100.4   Complete by: As directed    Diet - low sodium heart healthy   Complete by: As directed    Incentive spirometry RT   Complete by: As directed    Increase activity slowly   Complete by: As directed       Allergies as of 01/10/2023       Reactions   Toprol Xl [metoprolol] Rash        Medication List     TAKE these medications    acetaminophen 650 MG CR tablet Commonly known as: TYLENOL Take 1,300 mg by mouth every 8 (eight) hours as needed for pain.   cephALEXin 500 MG capsule Commonly known as: KEFLEX Take 1 capsule (500 mg total) by mouth 3 (three) times daily for 10 days.   cetirizine 10 MG  tablet Commonly known as: ZYRTEC Take 10 mg by mouth daily.   cyclobenzaprine 10 MG tablet Commonly known as: FLEXERIL Take 10 mg by mouth at bedtime.   Dexilant 60 MG capsule Generic drug: dexlansoprazole Take 60 mg by mouth daily.   Dialyvite Vitamin D 5000 125 MCG (5000 UT) capsule Generic drug: Cholecalciferol Take 5,000 Units by mouth daily.   HYDROmorphone 2 MG tablet Commonly known as: DILAUDID Take 1 tablet (2 mg total) by mouth every 4 (four) hours as needed for severe pain.   ibuprofen 800 MG tablet Commonly known as: ADVIL Take 800 mg by mouth 3 (three) times daily as needed for moderate pain.   LORazepam 0.5 MG tablet Commonly known as: ATIVAN Take 0.5 mg by mouth at bedtime.   Magnesium 400 MG Tabs Take 400 mg by mouth daily.   methocarbamol 500 MG tablet Commonly known as: ROBAXIN Take 1 tablet (500 mg total) by mouth every 6 (six) hours as needed for muscle spasms.   Probiotic (Lactobacillus) Caps Take 1 capsule by mouth daily.   promethazine 25 MG tablet Commonly known as: PHENERGAN Take 25 mg by mouth every 6 (six) hours as needed for vomiting or nausea.   rosuvastatin 20 MG tablet Commonly known as: CRESTOR Take 20 mg by mouth daily.   sildenafil 20 MG tablet Commonly known as: REVATIO Take 40-60 mg by mouth daily as needed (ED).   telmisartan-hydrochlorothiazide 80-25 MG tablet Commonly known as: MICARDIS HCT  Take 1 tablet by mouth daily.   vitamin C 1000 MG tablet Take 1,000 mg by mouth daily.   Zinc 50 MG Tabs Take 50 mg by mouth daily.         Signed: Blanchie Dessert Christol Thetford 01/10/2023, 1:02 PM

## 2023-01-10 NOTE — Progress Notes (Signed)
Patient alert and oriented, voiding adequately, skin clean, dry and intact without evidence of skin break down, or symptoms of complications - no redness or edema noted, only slight tenderness at site.  Patient states pain is manageable at time of discharge. Patient has an appointment with MD in 3 weeks 

## 2023-01-10 NOTE — Evaluation (Signed)
Occupational Therapy Evaluation Patient Details Name: Blake Cox MRN: [id number] DOB: [birth year] Today's Date: 01/10/2023   History of Present Illness Pt is a 72 y/o male who presents s/p L3-L5 PLIF on 01/08/2023. PMH significant for CKD, HTN, pre-diabetes, R knee unjection, prior back surgery 08/2022, cervical spine surgery.   Clinical Impression   Patient admitted for procedure above.  PTA he lives at home with his spouse, and needed no assist with ADL or iADL.  Patient has all needed DME at home, and will have needed assist.  All questions answered, precautions reviewed, and ADL completed.  No further OT needs in the acute setting.  Recommend follow up with MD as prescribed.        Recommendations for follow up therapy are one component of a multi-disciplinary discharge planning process, led by the attending physician.  Recommendations may be updated based on patient status, additional functional criteria and insurance authorization.   Follow Up Recommendations        Assistance Recommended at Discharge Set up Supervision/Assistance  Patient can return home with the following Assist for transportation;A little help with bathing/dressing/bathroom    Functional Status Assessment  Patient has had a recent decline in their functional status and demonstrates the ability to make significant improvements in function in a reasonable and predictable amount of time.  Equipment Recommendations  None recommended by OT    Recommendations for Other Services       Precautions / Restrictions Precautions Precautions: Fall;Back Required Braces or Orthoses: Spinal Brace Spinal Brace: Lumbar corset;Applied in sitting position Restrictions Weight Bearing Restrictions: No      Mobility Bed Mobility Overal bed mobility: Modified Independent                  Transfers Overall transfer level: Modified independent                        Balance Overall balance assessment:  Needs assistance Sitting-balance support: No upper extremity supported, Feet supported Sitting balance-Leahy Scale: Good     Standing balance support: Reliant on assistive device for balance Standing balance-Leahy Scale: Fair                             ADL either performed or assessed with clinical judgement   ADL Overall ADL's : At baseline                                       General ADL Comments: Min A with lower body ADL - placing socks     Vision Patient Visual Report: No change from baseline       Perception     Praxis      Pertinent Vitals/Pain Pain Assessment Pain Assessment: Faces Faces Pain Scale: Hurts little more Pain Location: Incision site Pain Descriptors / Indicators: Aching Pain Intervention(s): Monitored during session     Hand Dominance Right   Extremity/Trunk Assessment Upper Extremity Assessment Upper Extremity Assessment: Overall WFL for tasks assessed   Lower Extremity Assessment Lower Extremity Assessment: Defer to PT evaluation   Cervical / Trunk Assessment Cervical / Trunk Assessment: Back Surgery   Communication Communication Communication: No difficulties   Cognition Arousal/Alertness: Awake/alert Behavior During Therapy: WFL for tasks assessed/performed Overall Cognitive Status: Within Functional Limits for tasks assessed  General Comments   VSS on RA    Exercises     Shoulder Instructions      Home Living Family/patient expects to be discharged to:: Private residence Living Arrangements: Spouse/significant other Available Help at Discharge: Family;Available 24 hours/day Type of Home: House Home Access: Stairs to enter [employer] of Steps: 1   Home Layout: One level     Bathroom Shower/Tub: Occupational psychologist: Handicapped height Bathroom Accessibility: Yes How Accessible: Accessible via walker Home  Equipment: [place] (2 wheels);Shower seat;Grab bars - tub/shower          Prior Functioning/Environment Prior Level of Function : Independent/Modified Independent;Driving                        OT Problem List: Pain      OT Treatment/Interventions:      OT Goals(Current goals can be found in the care plan section) Acute Rehab OT Goals Patient Stated Goal: Return home OT Goal Formulation: With patient Time For Goal Achievement: 01/14/23 Potential to Achieve Goals: Good  OT Frequency:      Co-evaluation              AM-PAC OT "6 Clicks" Daily Activity     Outcome Measure Help from another person eating meals?: None Help from another person taking care of personal grooming?: None Help from another person toileting, which includes using toliet, bedpan, or urinal?: None Help from another person bathing (including washing, rinsing, drying)?: A Little Help from another person to put on and taking off regular upper body clothing?: None Help from another person to put on and taking off regular lower body clothing?: A Little 6 Click Score: 22   End of Session Equipment Utilized During Treatment: Rolling walker (2 wheels) Nurse Communication: Mobility status  Activity Tolerance: Patient tolerated treatment well Patient left: in bed;with call bell/phone within reach  OT Visit Diagnosis: Unsteadiness on feet (R26.81)                Time: [id number] OT Time Calculation (min): 20 min Charges:  OT General Charges $OT Visit: 1 Visit OT Evaluation $OT Eval Moderate Complexity: 1 Mod  01/10/2023  RP, OTR/L  Acute Rehabilitation Services  Office:  [phone]   Metta Clines 01/10/2023, 8:35 AM

## 2023-01-10 NOTE — Progress Notes (Signed)
Physical Therapy Treatment Patient Details Name: Blake Cox MRN: [id number] DOB: 1951-06-13 Today's Date: 01/10/2023   History of Present Illness Pt is a 72 y/o male who presents s/p L3-L5 PLIF on 01/08/2023. PMH significant for CKD, HTN, pre-diabetes, R knee unjection, prior back surgery 08/2022, cervical spine surgery.    PT Comments    Pt greeted sidelying in bed, agreeable and motivated for OOB mobility. Pt able to come to sitting with slightly increased time from flat bed without physical assist. Once seated EOB pt with mild c/o dizziness, resolving with increased time up, BP stable. Pt continues to demonstrate steady gait with supervision for safety and RW support. Pt with good recall and adherence to all back precautions throughout session. Continued education re; brace wear, activity recommendations and safety with pt verbalizing understanding. Anticipate safe discharge, with assist level outlined below, once medically cleared, will continue to follow acutely.    Recommendations for follow up therapy are one component of a multi-disciplinary discharge planning process, led by the attending physician.  Recommendations may be updated based on patient status, additional functional criteria and insurance authorization.  Follow Up Recommendations  No PT follow up     Assistance Recommended at Discharge PRN  Patient can return home with the following A little help with walking and/or transfers;A little help with bathing/dressing/bathroom;Assistance with cooking/housework;Assist for transportation;Help with stairs or ramp for entrance   Equipment Recommendations  None recommended by PT    Recommendations for Other Services       Precautions / Restrictions Precautions Precautions: Fall;Back Precaution Booklet Issued: Yes (comment) Precaution Comments: Reviewed handout and pt was cued for precautions during functional mobility. Required Braces or Orthoses: Spinal Brace Spinal Brace:  Lumbar corset;Applied in sitting position Restrictions Weight Bearing Restrictions: No     Mobility  Bed Mobility Overal bed mobility: Needs Assistance Bed Mobility: Sidelying to Sit   Sidelying to sit: Modified independent (Device/Increase time)       General bed mobility comments: slightly increased time to complete, HOB flat    Transfers Overall transfer level: Needs assistance Equipment used: Rolling walker (2 wheels) Transfers: Sit to/from Stand Sit to Stand: Supervision           General transfer comment: pt with good recall for hand placement, steady power up    Ambulation/Gait Ambulation/Gait assistance: Supervision Gait Distance (Feet): 560 Feet Assistive device: Rolling walker (2 wheels) Gait Pattern/deviations: Step-through pattern, Decreased stride length, Trunk flexed, Narrow base of support Gait velocity: Decreased     General Gait Details: No assist required and no overt LOB noted.   Stairs             Wheelchair Mobility    Modified Rankin (Stroke Patients Only)       Balance Overall balance assessment: Needs assistance Sitting-balance support: No upper extremity supported, Feet supported Sitting balance-Leahy Scale: Good     Standing balance support: Reliant on assistive device for balance Standing balance-Leahy Scale: Fair Standing balance comment: Reliant on RW for dynamic activity.                            Cognition Arousal/Alertness: Awake/alert Behavior During Therapy: WFL for tasks assessed/performed Overall Cognitive Status: Within Functional Limits for tasks assessed  Exercises      General Comments        Pertinent Vitals/Pain Pain Assessment Pain Assessment: Faces Faces Pain Scale: Hurts little more Pain Location: Incision site Pain Descriptors / Indicators: Aching Pain Intervention(s): Monitored during session    Home Living  Family/patient expects to be discharged to:: Private residence Living Arrangements: Spouse/significant other Available Help at Discharge: Family;Available 24 hours/day Type of Home: House Home Access: Stairs to enter   [employer] of Steps: 1   Home Layout: One level Home Equipment: Conservation officer, nature (2 wheels);Shower seat;Grab bars - tub/shower      Prior Function            PT Goals (current goals can now be found in the care plan section) Acute Rehab PT Goals Patient Stated Goal: Home today PT Goal Formulation: With patient/family Time For Goal Achievement: 01/16/23 Progress towards PT goals: Progressing toward goals    Frequency    Min 5X/week      PT Plan      Co-evaluation              AM-PAC PT "6 Clicks" Mobility   Outcome Measure  Help needed turning from your back to your side while in a flat bed without using bedrails?: A Little Help needed moving from lying on your back to sitting on the side of a flat bed without using bedrails?: A Little Help needed moving to and from a bed to a chair (including a wheelchair)?: A Little Help needed standing up from a chair using your arms (e.g., wheelchair or bedside chair)?: A Little Help needed to walk in hospital room?: A Little Help needed climbing 3-5 steps with a railing? : A Little 6 Click Score: 18    End of Session Equipment Utilized During Treatment: Gait belt;Back brace Activity Tolerance: Patient tolerated treatment well Patient left: with call bell/phone within reach;in bed (Sitting EOB) Nurse Communication: Mobility status PT Visit Diagnosis: Unsteadiness on feet (R26.81);Pain Pain - part of body:  (back)     Time: [id number] PT Time Calculation (min) (ACUTE ONLY): 13 min  Charges:  $Gait Training: 8-22 mins                     Lemoyne Nestor R. PTA Acute Rehabilitation Services Office: [facility] 01/10/2023, 12:15 PM

## 2023-01-22 DIAGNOSIS — I959 Hypotension, unspecified: Secondary | ICD-10-CM | POA: Diagnosis not present

## 2023-01-22 DIAGNOSIS — I10 Essential (primary) hypertension: Secondary | ICD-10-CM | POA: Diagnosis not present

## 2023-01-22 DIAGNOSIS — N182 Chronic kidney disease, stage 2 (mild): Secondary | ICD-10-CM | POA: Diagnosis not present

## 2023-01-22 DIAGNOSIS — D649 Anemia, unspecified: Secondary | ICD-10-CM | POA: Diagnosis not present

## 2023-01-22 DIAGNOSIS — Z09 Encounter for follow-up examination after completed treatment for conditions other than malignant neoplasm: Secondary | ICD-10-CM | POA: Diagnosis not present

## 2023-01-22 DIAGNOSIS — E782 Mixed hyperlipidemia: Secondary | ICD-10-CM | POA: Diagnosis not present

## 2023-01-22 DIAGNOSIS — R7303 Prediabetes: Secondary | ICD-10-CM | POA: Diagnosis not present

## 2023-01-25 MED FILL — Sodium Chloride IV Soln 0.9%: INTRAVENOUS | Qty: 3000 | Status: AC

## 2023-01-25 MED FILL — Heparin Sodium (Porcine) Inj 1000 Unit/ML: INTRAMUSCULAR | Qty: 30 | Status: AC

## 2023-01-28 DIAGNOSIS — M5136 Other intervertebral disc degeneration, lumbar region: Secondary | ICD-10-CM | POA: Diagnosis not present

## 2023-01-28 DIAGNOSIS — F419 Anxiety disorder, unspecified: Secondary | ICD-10-CM | POA: Diagnosis not present

## 2023-01-28 DIAGNOSIS — I10 Essential (primary) hypertension: Secondary | ICD-10-CM | POA: Diagnosis not present

## 2023-01-28 DIAGNOSIS — M5430 Sciatica, unspecified side: Secondary | ICD-10-CM | POA: Diagnosis not present

## 2023-01-28 DIAGNOSIS — K219 Gastro-esophageal reflux disease without esophagitis: Secondary | ICD-10-CM | POA: Diagnosis not present

## 2023-01-28 DIAGNOSIS — K5900 Constipation, unspecified: Secondary | ICD-10-CM | POA: Diagnosis not present

## 2023-01-28 DIAGNOSIS — E663 Overweight: Secondary | ICD-10-CM | POA: Diagnosis not present

## 2023-01-28 DIAGNOSIS — E785 Hyperlipidemia, unspecified: Secondary | ICD-10-CM | POA: Diagnosis not present

## 2023-01-28 DIAGNOSIS — G8929 Other chronic pain: Secondary | ICD-10-CM | POA: Diagnosis not present

## 2023-01-28 DIAGNOSIS — J309 Allergic rhinitis, unspecified: Secondary | ICD-10-CM | POA: Diagnosis not present

## 2023-01-28 DIAGNOSIS — H9193 Unspecified hearing loss, bilateral: Secondary | ICD-10-CM | POA: Diagnosis not present

## 2023-01-28 DIAGNOSIS — M545 Low back pain, unspecified: Secondary | ICD-10-CM | POA: Diagnosis not present

## 2023-01-30 DIAGNOSIS — M48061 Spinal stenosis, lumbar region without neurogenic claudication: Secondary | ICD-10-CM | POA: Diagnosis not present

## 2023-02-12 DIAGNOSIS — E782 Mixed hyperlipidemia: Secondary | ICD-10-CM | POA: Diagnosis not present

## 2023-02-12 DIAGNOSIS — R7303 Prediabetes: Secondary | ICD-10-CM | POA: Diagnosis not present

## 2023-02-12 DIAGNOSIS — D649 Anemia, unspecified: Secondary | ICD-10-CM | POA: Diagnosis not present

## 2023-02-12 DIAGNOSIS — N182 Chronic kidney disease, stage 2 (mild): Secondary | ICD-10-CM | POA: Diagnosis not present

## 2023-02-12 DIAGNOSIS — I10 Essential (primary) hypertension: Secondary | ICD-10-CM | POA: Diagnosis not present

## 2023-03-06 DIAGNOSIS — Z125 Encounter for screening for malignant neoplasm of prostate: Secondary | ICD-10-CM | POA: Diagnosis not present

## 2023-03-06 DIAGNOSIS — M48061 Spinal stenosis, lumbar region without neurogenic claudication: Secondary | ICD-10-CM | POA: Diagnosis not present

## 2023-03-06 DIAGNOSIS — I10 Essential (primary) hypertension: Secondary | ICD-10-CM | POA: Diagnosis not present

## 2023-03-06 DIAGNOSIS — N182 Chronic kidney disease, stage 2 (mild): Secondary | ICD-10-CM | POA: Diagnosis not present

## 2023-03-06 DIAGNOSIS — R7303 Prediabetes: Secondary | ICD-10-CM | POA: Diagnosis not present

## 2023-03-06 DIAGNOSIS — Z0000 Encounter for general adult medical examination without abnormal findings: Secondary | ICD-10-CM | POA: Diagnosis not present

## 2023-03-06 DIAGNOSIS — E782 Mixed hyperlipidemia: Secondary | ICD-10-CM | POA: Diagnosis not present

## 2023-03-06 DIAGNOSIS — Z6827 Body mass index (BMI) 27.0-27.9, adult: Secondary | ICD-10-CM | POA: Diagnosis not present

## 2023-03-15 DIAGNOSIS — M48061 Spinal stenosis, lumbar region without neurogenic claudication: Secondary | ICD-10-CM | POA: Diagnosis not present

## 2023-03-15 DIAGNOSIS — E782 Mixed hyperlipidemia: Secondary | ICD-10-CM | POA: Diagnosis not present

## 2023-03-15 DIAGNOSIS — I10 Essential (primary) hypertension: Secondary | ICD-10-CM | POA: Diagnosis not present

## 2023-03-15 DIAGNOSIS — R7303 Prediabetes: Secondary | ICD-10-CM | POA: Diagnosis not present

## 2023-03-15 DIAGNOSIS — M545 Low back pain, unspecified: Secondary | ICD-10-CM | POA: Diagnosis not present

## 2023-03-15 DIAGNOSIS — M5440 Lumbago with sciatica, unspecified side: Secondary | ICD-10-CM | POA: Diagnosis not present

## 2023-03-15 DIAGNOSIS — Z0000 Encounter for general adult medical examination without abnormal findings: Secondary | ICD-10-CM | POA: Diagnosis not present

## 2023-03-15 DIAGNOSIS — F419 Anxiety disorder, unspecified: Secondary | ICD-10-CM | POA: Diagnosis not present

## 2023-03-15 DIAGNOSIS — N182 Chronic kidney disease, stage 2 (mild): Secondary | ICD-10-CM | POA: Diagnosis not present

## 2023-03-15 DIAGNOSIS — G8929 Other chronic pain: Secondary | ICD-10-CM | POA: Diagnosis not present

## 2023-03-20 DIAGNOSIS — M545 Low back pain, unspecified: Secondary | ICD-10-CM | POA: Diagnosis not present

## 2023-03-20 DIAGNOSIS — M48061 Spinal stenosis, lumbar region without neurogenic claudication: Secondary | ICD-10-CM | POA: Diagnosis not present

## 2023-03-22 DIAGNOSIS — M545 Low back pain, unspecified: Secondary | ICD-10-CM | POA: Diagnosis not present

## 2023-03-22 DIAGNOSIS — M48061 Spinal stenosis, lumbar region without neurogenic claudication: Secondary | ICD-10-CM | POA: Diagnosis not present

## 2023-03-25 DIAGNOSIS — M48061 Spinal stenosis, lumbar region without neurogenic claudication: Secondary | ICD-10-CM | POA: Diagnosis not present

## 2023-03-25 DIAGNOSIS — M545 Low back pain, unspecified: Secondary | ICD-10-CM | POA: Diagnosis not present

## 2023-03-28 DIAGNOSIS — M48061 Spinal stenosis, lumbar region without neurogenic claudication: Secondary | ICD-10-CM | POA: Diagnosis not present

## 2023-03-28 DIAGNOSIS — M545 Low back pain, unspecified: Secondary | ICD-10-CM | POA: Diagnosis not present

## 2023-04-02 DIAGNOSIS — M48061 Spinal stenosis, lumbar region without neurogenic claudication: Secondary | ICD-10-CM | POA: Diagnosis not present

## 2023-04-02 DIAGNOSIS — M545 Low back pain, unspecified: Secondary | ICD-10-CM | POA: Diagnosis not present

## 2023-04-04 DIAGNOSIS — M545 Low back pain, unspecified: Secondary | ICD-10-CM | POA: Diagnosis not present

## 2023-04-04 DIAGNOSIS — M48061 Spinal stenosis, lumbar region without neurogenic claudication: Secondary | ICD-10-CM | POA: Diagnosis not present

## 2023-04-09 DIAGNOSIS — M48061 Spinal stenosis, lumbar region without neurogenic claudication: Secondary | ICD-10-CM | POA: Diagnosis not present

## 2023-04-09 DIAGNOSIS — M545 Low back pain, unspecified: Secondary | ICD-10-CM | POA: Diagnosis not present

## 2023-04-17 DIAGNOSIS — M545 Low back pain, unspecified: Secondary | ICD-10-CM | POA: Diagnosis not present

## 2023-04-17 DIAGNOSIS — M48061 Spinal stenosis, lumbar region without neurogenic claudication: Secondary | ICD-10-CM | POA: Diagnosis not present

## 2023-04-18 DIAGNOSIS — S90222A Contusion of left lesser toe(s) with damage to nail, initial encounter: Secondary | ICD-10-CM | POA: Diagnosis not present

## 2023-04-18 DIAGNOSIS — M48061 Spinal stenosis, lumbar region without neurogenic claudication: Secondary | ICD-10-CM | POA: Diagnosis not present

## 2023-04-18 DIAGNOSIS — M545 Low back pain, unspecified: Secondary | ICD-10-CM | POA: Diagnosis not present

## 2023-04-18 DIAGNOSIS — G629 Polyneuropathy, unspecified: Secondary | ICD-10-CM | POA: Diagnosis not present

## 2023-04-23 DIAGNOSIS — M545 Low back pain, unspecified: Secondary | ICD-10-CM | POA: Diagnosis not present

## 2023-04-23 DIAGNOSIS — M48061 Spinal stenosis, lumbar region without neurogenic claudication: Secondary | ICD-10-CM | POA: Diagnosis not present

## 2023-04-25 DIAGNOSIS — M48061 Spinal stenosis, lumbar region without neurogenic claudication: Secondary | ICD-10-CM | POA: Diagnosis not present

## 2023-04-25 DIAGNOSIS — M545 Low back pain, unspecified: Secondary | ICD-10-CM | POA: Diagnosis not present

## 2023-04-25 DIAGNOSIS — H6120 Impacted cerumen, unspecified ear: Secondary | ICD-10-CM | POA: Diagnosis not present

## 2023-05-14 DIAGNOSIS — M545 Low back pain, unspecified: Secondary | ICD-10-CM | POA: Diagnosis not present

## 2023-05-14 DIAGNOSIS — M48061 Spinal stenosis, lumbar region without neurogenic claudication: Secondary | ICD-10-CM | POA: Diagnosis not present

## 2023-05-16 DIAGNOSIS — M48061 Spinal stenosis, lumbar region without neurogenic claudication: Secondary | ICD-10-CM | POA: Diagnosis not present

## 2023-05-16 DIAGNOSIS — M545 Low back pain, unspecified: Secondary | ICD-10-CM | POA: Diagnosis not present

## 2023-05-20 DIAGNOSIS — M48061 Spinal stenosis, lumbar region without neurogenic claudication: Secondary | ICD-10-CM | POA: Diagnosis not present

## 2023-05-20 DIAGNOSIS — M545 Low back pain, unspecified: Secondary | ICD-10-CM | POA: Diagnosis not present

## 2023-05-28 DIAGNOSIS — M545 Low back pain, unspecified: Secondary | ICD-10-CM | POA: Diagnosis not present

## 2023-05-28 DIAGNOSIS — M48061 Spinal stenosis, lumbar region without neurogenic claudication: Secondary | ICD-10-CM | POA: Diagnosis not present

## 2023-06-04 DIAGNOSIS — M48061 Spinal stenosis, lumbar region without neurogenic claudication: Secondary | ICD-10-CM | POA: Diagnosis not present

## 2023-06-04 DIAGNOSIS — M545 Low back pain, unspecified: Secondary | ICD-10-CM | POA: Diagnosis not present

## 2023-06-05 DIAGNOSIS — Z6828 Body mass index (BMI) 28.0-28.9, adult: Secondary | ICD-10-CM | POA: Diagnosis not present

## 2023-06-05 DIAGNOSIS — M48061 Spinal stenosis, lumbar region without neurogenic claudication: Secondary | ICD-10-CM | POA: Diagnosis not present

## 2023-06-11 DIAGNOSIS — M48061 Spinal stenosis, lumbar region without neurogenic claudication: Secondary | ICD-10-CM | POA: Diagnosis not present

## 2023-06-11 DIAGNOSIS — M4856XA Collapsed vertebra, not elsewhere classified, lumbar region, initial encounter for fracture: Secondary | ICD-10-CM | POA: Diagnosis not present

## 2023-06-11 DIAGNOSIS — M5136 Other intervertebral disc degeneration, lumbar region: Secondary | ICD-10-CM | POA: Diagnosis not present

## 2023-06-11 DIAGNOSIS — M47816 Spondylosis without myelopathy or radiculopathy, lumbar region: Secondary | ICD-10-CM | POA: Diagnosis not present

## 2023-06-25 DIAGNOSIS — M25551 Pain in right hip: Secondary | ICD-10-CM | POA: Diagnosis not present

## 2023-06-25 DIAGNOSIS — M48061 Spinal stenosis, lumbar region without neurogenic claudication: Secondary | ICD-10-CM | POA: Diagnosis not present

## 2023-06-25 DIAGNOSIS — Z6827 Body mass index (BMI) 27.0-27.9, adult: Secondary | ICD-10-CM | POA: Diagnosis not present

## 2023-06-25 DIAGNOSIS — M25552 Pain in left hip: Secondary | ICD-10-CM | POA: Diagnosis not present

## 2023-06-25 DIAGNOSIS — Z981 Arthrodesis status: Secondary | ICD-10-CM | POA: Diagnosis not present

## 2023-07-09 ENCOUNTER — Other Ambulatory Visit: Payer: Self-pay | Admitting: Neurological Surgery

## 2023-07-24 NOTE — Progress Notes (Signed)
Surgical Instructions   Your procedure is scheduled on 08-06-23. Report to [facility] Main Entrance "A" at 9:00 A.M., then check in with the Admitting office. Any questions or running late day of surgery: call [phone]  Questions prior to your surgery date: call [phone], Monday-Friday, 8am-4pm. If you experience any cold or flu symptoms such as cough, fever, chills, shortness of breath, etc. between now and your scheduled surgery, please notify us at the above number.     Remember:  Do not eat after midnight the night before your surgery    Take these medicines the morning of surgery with A SIP OF WATER  cetirizine (ZYRTEC) cyclobenzaprine (FLEXERIL)  DEXILANT  rosuvastatin (CRESTOR)    May take these medicines IF NEEDED: acetaminophen (TYLENOL)  LORazepam (ATIVAN)  promethazine (PHENERGAN)     One week prior to surgery, STOP taking any Aspirin (unless otherwise instructed by your surgeon) Aleve, Naproxen, Ibuprofen, Motrin, Advil, Goody's, BC's, all herbal medications, fish oil, and non-prescription vitamins.                     Do NOT Smoke (Tobacco/Vaping) for 24 hours prior to your procedure.  If you use a CPAP at night, you may bring your mask/headgear for your overnight stay.   You will be asked to remove any contacts, glasses, piercing's, hearing aid's, dentures/partials prior to surgery. Please bring cases for these items if needed.    Patients discharged the day of surgery will not be allowed to drive home, and someone needs to stay with them for 24 hours.  SURGICAL WAITING ROOM VISITATION Patients may have no more than 2 support people in the waiting area - these visitors may rotate.   Pre-op nurse will coordinate an appropriate time for 1 ADULT support person, who may not rotate, to accompany patient in pre-op.  Children under the age of 64 must have an adult with them who is not the patient and must remain in the main waiting area with an adult.  If  the patient needs to stay at the hospital during part of their recovery, the visitor guidelines for inpatient rooms apply.  Please refer to the [facility] website for the visitor guidelines for any additional information.   If you received a COVID test during your pre-op visit  it is requested that you wear a mask when out in public, stay away from anyone that may not be feeling well and notify your surgeon if you develop symptoms. If you have been in contact with anyone that has tested positive in the last 10 days please notify you surgeon.      Pre-operative 5 CHG Bathing Instructions   You can play a key role in reducing the risk of infection after surgery. Your skin needs to be as free of germs as possible. You can reduce the number of germs on your skin by washing with CHG (chlorhexidine gluconate) soap before surgery. CHG is an antiseptic soap that kills germs and continues to kill germs even after washing.   DO NOT use if you have an allergy to chlorhexidine/CHG or antibacterial soaps. If your skin becomes reddened or irritated, stop using the CHG and notify one of our RNs at [phone].   Please shower with the CHG soap starting 4 days before surgery using the following schedule:     Please keep in mind the following:  DO NOT shave, including legs and underarms, starting the day of your first shower.   You may  shave your face at any point before/day of surgery.  Place clean sheets on your bed the day you start using CHG soap. Use a clean washcloth (not used since being washed) for each shower. DO NOT sleep with pets once you start using the CHG.   CHG Shower Instructions:  If you choose to wash your hair and private area, wash first with your normal shampoo/soap.  After you use shampoo/soap, rinse your hair and body thoroughly to remove shampoo/soap residue.  Turn the water OFF and apply about 3 tablespoons (45 ml) of CHG soap to a CLEAN washcloth.  Apply CHG soap ONLY FROM  YOUR NECK DOWN TO YOUR TOES (washing for 3-5 minutes)  DO NOT use CHG soap on face, private areas, open wounds, or sores.  Pay special attention to the area where your surgery is being performed.  If you are having back surgery, having someone wash your back for you may be helpful. Wait 2 minutes after CHG soap is applied, then you may rinse off the CHG soap.  Pat dry with a clean towel  Put on clean clothes/pajamas   If you choose to wear lotion, please use ONLY the CHG-compatible lotions on the back of this paper.   Additional instructions for the day of surgery: DO NOT APPLY any lotions, deodorants, cologne, or perfumes.   Do not bring valuables to the hospital. [facility] is not responsible for any belongings/valuables. Do not wear nail polish, gel polish, artificial nails, or any other type of covering on natural nails (fingers and toes) Do not wear jewelry or makeup Put on clean/comfortable clothes.  Please brush your teeth.  Ask your nurse before applying any prescription medications to the skin.     CHG Compatible Lotions   Aveeno Moisturizing lotion  Cetaphil Moisturizing Cream  Cetaphil Moisturizing Lotion  Clairol Herbal Essence Moisturizing Lotion, Dry Skin  Clairol Herbal Essence Moisturizing Lotion, Extra Dry Skin  Clairol Herbal Essence Moisturizing Lotion, Normal Skin  Curel Age Defying Therapeutic Moisturizing Lotion with Alpha Hydroxy  Curel Extreme Care Body Lotion  Curel Soothing Hands Moisturizing Hand Lotion  Curel Therapeutic Moisturizing Cream, Fragrance-Free  Curel Therapeutic Moisturizing Lotion, Fragrance-Free  Curel Therapeutic Moisturizing Lotion, Original Formula  Eucerin Daily Replenishing Lotion  Eucerin Dry Skin Therapy Plus Alpha Hydroxy Crme  Eucerin Dry Skin Therapy Plus Alpha Hydroxy Lotion  Eucerin Original Crme  Eucerin Original Lotion  Eucerin Plus Crme Eucerin Plus Lotion  Eucerin TriLipid Replenishing Lotion  Keri Anti-Bacterial  Hand Lotion  Keri Deep Conditioning Original Lotion Dry Skin Formula Softly Scented  Keri Deep Conditioning Original Lotion, Fragrance Free Sensitive Skin Formula  Keri Lotion Fast Absorbing Fragrance Free Sensitive Skin Formula  Keri Lotion Fast Absorbing Softly Scented Dry Skin Formula  Keri Original Lotion  Keri Skin Renewal Lotion Keri Silky Smooth Lotion  Keri Silky Smooth Sensitive Skin Lotion  Nivea Body Creamy Conditioning Oil  Nivea Body Extra Enriched Lotion  Nivea Body Original Lotion  Nivea Body Sheer Moisturizing Lotion Nivea Crme  Nivea Skin Firming Lotion  NutraDerm 30 Skin Lotion  NutraDerm Skin Lotion  NutraDerm Therapeutic Skin Cream  NutraDerm Therapeutic Skin Lotion  ProShield Protective Hand Cream  Provon moisturizing lotion  Please read over the following fact sheets that you were given.

## 2023-07-24 NOTE — Progress Notes (Signed)
Surgical Instructions   Your procedure is scheduled on 08-06-23. Report to [facility] Main Entrance "A" at 9:00 A.M., then check in with the Admitting office. Any questions or running late day of surgery: call [phone]  Questions prior to your surgery date: call [phone], Monday-Friday, 8am-4pm. If you experience any cold or flu symptoms such as cough, fever, chills, shortness of breath, etc. between now and your scheduled surgery, please notify us at the above number.     Remember:  Do not eat or drink after midnight the night before your surgery    Take these medicines the morning of surgery with A SIP OF WATER  cetirizine (ZYRTEC) cyclobenzaprine (FLEXERIL)  DEXILANT  rosuvastatin (CRESTOR)    May take these medicines IF NEEDED: acetaminophen (TYLENOL)  LORazepam (ATIVAN)  promethazine (PHENERGAN)     One week prior to surgery, STOP taking any Aspirin (unless otherwise instructed by your surgeon) Aleve, Naproxen, Ibuprofen, Motrin, Advil, Goody's, BC's, all herbal medications, fish oil, and non-prescription vitamins.                     Do NOT Smoke (Tobacco/Vaping) for 24 hours prior to your procedure.  If you use a CPAP at night, you may bring your mask/headgear for your overnight stay.   You will be asked to remove any contacts, glasses, piercing's, hearing aid's, dentures/partials prior to surgery. Please bring cases for these items if needed.    Patients discharged the day of surgery will not be allowed to drive home, and someone needs to stay with them for 24 hours.  SURGICAL WAITING ROOM VISITATION Patients may have no more than 2 support people in the waiting area - these visitors may rotate.   Pre-op nurse will coordinate an appropriate time for 1 ADULT support person, who may not rotate, to accompany patient in pre-op.  Children under the age of 57 must have an adult with them who is not the patient and must remain in the main waiting area with an  adult.  If the patient needs to stay at the hospital during part of their recovery, the visitor guidelines for inpatient rooms apply.  Please refer to the [facility] website for the visitor guidelines for any additional information.   If you received a COVID test during your pre-op visit  it is requested that you wear a mask when out in public, stay away from anyone that may not be feeling well and notify your surgeon if you develop symptoms. If you have been in contact with anyone that has tested positive in the last 10 days please notify you surgeon.      Pre-operative 5 CHG Bathing Instructions   You can play a key role in reducing the risk of infection after surgery. Your skin needs to be as free of germs as possible. You can reduce the number of germs on your skin by washing with CHG (chlorhexidine gluconate) soap before surgery. CHG is an antiseptic soap that kills germs and continues to kill germs even after washing.   DO NOT use if you have an allergy to chlorhexidine/CHG or antibacterial soaps. If your skin becomes reddened or irritated, stop using the CHG and notify one of our RNs at [phone].   Please shower with the CHG soap starting 4 days before surgery using the following schedule:     Please keep in mind the following:  DO NOT shave, including legs and underarms, starting the day of your first shower.  You may shave your face at any point before/day of surgery.  Place clean sheets on your bed the day you start using CHG soap. Use a clean washcloth (not used since being washed) for each shower. DO NOT sleep with pets once you start using the CHG.   CHG Shower Instructions:  If you choose to wash your hair and private area, wash first with your normal shampoo/soap.  After you use shampoo/soap, rinse your hair and body thoroughly to remove shampoo/soap residue.  Turn the water OFF and apply about 3 tablespoons (45 ml) of CHG soap to a CLEAN washcloth.  Apply CHG  soap ONLY FROM YOUR NECK DOWN TO YOUR TOES (washing for 3-5 minutes)  DO NOT use CHG soap on face, private areas, open wounds, or sores.  Pay special attention to the area where your surgery is being performed.  If you are having back surgery, having someone wash your back for you may be helpful. Wait 2 minutes after CHG soap is applied, then you may rinse off the CHG soap.  Pat dry with a clean towel  Put on clean clothes/pajamas   If you choose to wear lotion, please use ONLY the CHG-compatible lotions on the back of this paper.   Additional instructions for the day of surgery: DO NOT APPLY any lotions, deodorants, cologne, or perfumes.   Do not bring valuables to the hospital. [facility] is not responsible for any belongings/valuables. Do not wear nail polish, gel polish, artificial nails, or any other type of covering on natural nails (fingers and toes) Do not wear jewelry or makeup Put on clean/comfortable clothes.  Please brush your teeth.  Ask your nurse before applying any prescription medications to the skin.     CHG Compatible Lotions   Aveeno Moisturizing lotion  Cetaphil Moisturizing Cream  Cetaphil Moisturizing Lotion  Clairol Herbal Essence Moisturizing Lotion, Dry Skin  Clairol Herbal Essence Moisturizing Lotion, Extra Dry Skin  Clairol Herbal Essence Moisturizing Lotion, Normal Skin  Curel Age Defying Therapeutic Moisturizing Lotion with Alpha Hydroxy  Curel Extreme Care Body Lotion  Curel Soothing Hands Moisturizing Hand Lotion  Curel Therapeutic Moisturizing Cream, Fragrance-Free  Curel Therapeutic Moisturizing Lotion, Fragrance-Free  Curel Therapeutic Moisturizing Lotion, Original Formula  Eucerin Daily Replenishing Lotion  Eucerin Dry Skin Therapy Plus Alpha Hydroxy Crme  Eucerin Dry Skin Therapy Plus Alpha Hydroxy Lotion  Eucerin Original Crme  Eucerin Original Lotion  Eucerin Plus Crme Eucerin Plus Lotion  Eucerin TriLipid Replenishing Lotion  Keri  Anti-Bacterial Hand Lotion  Keri Deep Conditioning Original Lotion Dry Skin Formula Softly Scented  Keri Deep Conditioning Original Lotion, Fragrance Free Sensitive Skin Formula  Keri Lotion Fast Absorbing Fragrance Free Sensitive Skin Formula  Keri Lotion Fast Absorbing Softly Scented Dry Skin Formula  Keri Original Lotion  Keri Skin Renewal Lotion Keri Silky Smooth Lotion  Keri Silky Smooth Sensitive Skin Lotion  Nivea Body Creamy Conditioning Oil  Nivea Body Extra Enriched Lotion  Nivea Body Original Lotion  Nivea Body Sheer Moisturizing Lotion Nivea Crme  Nivea Skin Firming Lotion  NutraDerm 30 Skin Lotion  NutraDerm Skin Lotion  NutraDerm Therapeutic Skin Cream  NutraDerm Therapeutic Skin Lotion  ProShield Protective Hand Cream  Provon moisturizing lotion  Please read over the following fact sheets that you were given.

## 2023-07-25 ENCOUNTER — Other Ambulatory Visit: Payer: Self-pay

## 2023-07-25 ENCOUNTER — Encounter (HOSPITAL_COMMUNITY): Payer: Self-pay

## 2023-07-25 ENCOUNTER — Encounter (HOSPITAL_COMMUNITY)
Admission: RE | Admit: 2023-07-25 | Discharge: 2023-07-25 | Disposition: A | Discharge status: Home / Self Care | Payer: PPO | Source: Ambulatory Visit | Attending: Neurological Surgery | Admitting: Neurological Surgery

## 2023-07-25 VITALS — BP 122/82 | HR 78 | Temp 98.4°F | Resp 18 | Ht 70.0 in | Wt 191.1 lb

## 2023-07-25 DIAGNOSIS — I10 Essential (primary) hypertension: Secondary | ICD-10-CM | POA: Diagnosis not present

## 2023-07-25 DIAGNOSIS — Z01812 Encounter for preprocedural laboratory examination: Secondary | ICD-10-CM | POA: Insufficient documentation

## 2023-07-25 DIAGNOSIS — Z01818 Encounter for other preprocedural examination: Secondary | ICD-10-CM

## 2023-07-25 DIAGNOSIS — Z01810 Encounter for preprocedural cardiovascular examination: Secondary | ICD-10-CM | POA: Insufficient documentation

## 2023-07-25 HISTORY — DX: Gastro-esophageal reflux disease without esophagitis: K21.9

## 2023-07-25 LAB — CBC
HCT: 47.9 % (ref 39.0–52.0)
Hemoglobin: 15.8 g/dL (ref 13.0–17.0)
MCH: 30.6 pg (ref 26.0–34.0)
MCHC: 33 g/dL (ref 30.0–36.0)
MCV: 92.8 fL (ref 80.0–100.0)
Platelets: 257 10*3/uL (ref 150–400)
RBC: 5.16 MIL/uL (ref 4.22–5.81)
RDW: 14.5 % (ref 11.5–15.5)
WBC: 7.8 10*3/uL (ref 4.0–10.5)
nRBC: 0 % (ref 0.0–0.2)

## 2023-07-25 LAB — BASIC METABOLIC PANEL
Anion gap: 10 (ref 5–15)
BUN: 21 mg/dL (ref 8–23)
CO2: 25 mmol/L (ref 22–32)
Calcium: 9.2 mg/dL (ref 8.9–10.3)
Chloride: 100 mmol/L (ref 98–111)
Creatinine, Ser: 1.25 mg/dL — ABNORMAL HIGH (ref 0.61–1.24)
GFR, Estimated: >60 mL/min (ref >60)
Glucose, Bld: 99 mg/dL (ref 70–99)
Potassium: 3.9 mmol/L (ref 3.5–5.1)
Sodium: 135 mmol/L (ref 135–145)

## 2023-07-25 LAB — TYPE AND SCREEN
ABO/RH(D): O POS
Antibody Screen: NEGATIVE

## 2023-07-25 LAB — SURGICAL PCR SCREEN
MRSA, PCR: NEGATIVE
Staphylococcus aureus: NEGATIVE

## 2023-07-25 NOTE — Progress Notes (Signed)
PCP - Dr. Georgianne Fick Cardiologist - denies  PPM/ICD - denies   Chest x-ray - 12/12/00 EKG - 07/25/23 Stress Test - 10+ years ago, normal per pt ECHO - 05/13/19 Cardiac Cath - denies  Sleep Study - denies   DM- pre-diabetic   ASA/Blood Thinner Instructions: n/a  ERAS Protcol - no, NPO   COVID TEST- n/a   Anesthesia review: no  Patient denies shortness of breath, fever, cough and chest pain at PAT appointment   All instructions explained to the patient, with a verbal understanding of the material. Patient agrees to go over the instructions while at home for a better understanding.  The opportunity to ask questions was provided.

## 2023-07-29 ENCOUNTER — Other Ambulatory Visit: Payer: Self-pay | Admitting: Neurological Surgery

## 2023-08-06 ENCOUNTER — Inpatient Hospital Stay (HOSPITAL_COMMUNITY)
Admission: RE | Admit: 2023-08-06 | Discharge: 2023-08-07 | DRG: 460 | Disposition: A | Discharge status: Home / Self Care | Payer: PPO | Attending: Neurological Surgery | Admitting: Neurological Surgery

## 2023-08-06 ENCOUNTER — Encounter (HOSPITAL_COMMUNITY)
Admission: RE | Disposition: A | Discharge status: Home / Self Care | Payer: Self-pay | Source: Home / Self Care | Attending: Neurological Surgery

## 2023-08-06 ENCOUNTER — Inpatient Hospital Stay (HOSPITAL_COMMUNITY): Payer: PPO | Admitting: Anesthesiology

## 2023-08-06 ENCOUNTER — Inpatient Hospital Stay (HOSPITAL_COMMUNITY): Payer: PPO

## 2023-08-06 ENCOUNTER — Encounter (HOSPITAL_COMMUNITY): Payer: Self-pay | Admitting: Neurological Surgery

## 2023-08-06 ENCOUNTER — Other Ambulatory Visit: Payer: Self-pay

## 2023-08-06 DIAGNOSIS — M48062 Spinal stenosis, lumbar region with neurogenic claudication: Secondary | ICD-10-CM | POA: Diagnosis not present

## 2023-08-06 DIAGNOSIS — K219 Gastro-esophageal reflux disease without esophagitis: Secondary | ICD-10-CM | POA: Diagnosis present

## 2023-08-06 DIAGNOSIS — E7800 Pure hypercholesterolemia, unspecified: Secondary | ICD-10-CM | POA: Diagnosis present

## 2023-08-06 DIAGNOSIS — Z888 Allergy status to other drugs, medicaments and biological substances status: Secondary | ICD-10-CM

## 2023-08-06 DIAGNOSIS — Z79899 Other long term (current) drug therapy: Secondary | ICD-10-CM | POA: Diagnosis not present

## 2023-08-06 DIAGNOSIS — G43909 Migraine, unspecified, not intractable, without status migrainosus: Secondary | ICD-10-CM | POA: Diagnosis present

## 2023-08-06 DIAGNOSIS — N189 Chronic kidney disease, unspecified: Secondary | ICD-10-CM | POA: Diagnosis not present

## 2023-08-06 DIAGNOSIS — M4726 Other spondylosis with radiculopathy, lumbar region: Secondary | ICD-10-CM | POA: Diagnosis present

## 2023-08-06 DIAGNOSIS — R7303 Prediabetes: Secondary | ICD-10-CM | POA: Diagnosis not present

## 2023-08-06 DIAGNOSIS — Z981 Arthrodesis status: Secondary | ICD-10-CM

## 2023-08-06 DIAGNOSIS — I129 Hypertensive chronic kidney disease with stage 1 through stage 4 chronic kidney disease, or unspecified chronic kidney disease: Secondary | ICD-10-CM | POA: Diagnosis present

## 2023-08-06 DIAGNOSIS — M48061 Spinal stenosis, lumbar region without neurogenic claudication: Secondary | ICD-10-CM

## 2023-08-06 DIAGNOSIS — Z0189 Encounter for other specified special examinations: Secondary | ICD-10-CM | POA: Diagnosis not present

## 2023-08-06 DIAGNOSIS — M5416 Radiculopathy, lumbar region: Secondary | ICD-10-CM | POA: Diagnosis not present

## 2023-08-06 DIAGNOSIS — M4310 Spondylolisthesis, site unspecified: Secondary | ICD-10-CM | POA: Diagnosis present

## 2023-08-06 HISTORY — PX: ANTERIOR LAT LUMBAR FUSION: SHX1168

## 2023-08-06 SURGERY — ANTERIOR LATERAL LUMBAR FUSION 1 LEVEL
Anesthesia: General

## 2023-08-06 MED ORDER — 0.9 % SODIUM CHLORIDE (POUR BTL) OPTIME
TOPICAL | Status: DC | PRN
  Administered 2023-08-06: 1000 mL

## 2023-08-06 MED ORDER — PROPOFOL 10 MG/ML IV BOLUS
INTRAVENOUS | Status: DC | PRN
  Administered 2023-08-06: 160 mg via INTRAVENOUS

## 2023-08-06 MED ORDER — CYCLOBENZAPRINE HCL 10 MG PO TABS: 10.0000 mg | ORAL_TABLET | Freq: Every day | ORAL | Status: DC

## 2023-08-06 MED ORDER — ACETAMINOPHEN 650 MG RE SUPP: 650.0000 mg | RECTAL | Status: DC | PRN

## 2023-08-06 MED ORDER — AMISULPRIDE (ANTIEMETIC) 5 MG/2ML IV SOLN
5.0000 mg | Freq: Once | INTRAVENOUS | Status: AC
  Administered 2023-08-06: 5 mg via INTRAVENOUS

## 2023-08-06 MED ORDER — FENTANYL CITRATE (PF) 250 MCG/5ML IJ SOLN
INTRAMUSCULAR | Status: AC
  Filled 2023-08-06: qty 5

## 2023-08-06 MED ORDER — FLEET ENEMA RE ENEM: 1.0000 | ENEMA | Freq: Once | RECTAL | Status: DC | PRN

## 2023-08-06 MED ORDER — ROSUVASTATIN CALCIUM 20 MG PO TABS
20.0000 mg | ORAL_TABLET | Freq: Every day | ORAL | Status: DC
  Administered 2023-08-07: 20 mg via ORAL
  Filled 2023-08-06: qty 1

## 2023-08-06 MED ORDER — ROCURONIUM BROMIDE 10 MG/ML (PF) SYRINGE
PREFILLED_SYRINGE | INTRAVENOUS | Status: DC | PRN
  Administered 2023-08-06: 70 mg via INTRAVENOUS

## 2023-08-06 MED ORDER — SUFENTANIL CITRATE 50 MCG/ML IV SOLN
1.0000 ug/kg/h | INTRAVENOUS | Status: AC
  Administered 2023-08-06: .4 ug/kg/h via INTRAVENOUS
  Filled 2023-08-06: qty 1

## 2023-08-06 MED ORDER — CEFAZOLIN SODIUM-DEXTROSE 2-4 GM/100ML-% IV SOLN
INTRAVENOUS | Status: AC
  Filled 2023-08-06: qty 100

## 2023-08-06 MED ORDER — LORAZEPAM 0.5 MG PO TABS: 0.5000 mg | ORAL_TABLET | Freq: Every evening | ORAL | Status: DC | PRN

## 2023-08-06 MED ORDER — CEFAZOLIN SODIUM-DEXTROSE 2-4 GM/100ML-% IV SOLN
2.0000 g | Freq: Three times a day (TID) | INTRAVENOUS | Status: AC
  Administered 2023-08-06 – 2023-08-07 (×2): 2 g via INTRAVENOUS
  Filled 2023-08-06 (×2): qty 100

## 2023-08-06 MED ORDER — LACTATED RINGERS IV SOLN: INTRAVENOUS | Status: DC

## 2023-08-06 MED ORDER — ACETAMINOPHEN 500 MG PO TABS: 1000.0000 mg | ORAL_TABLET | Freq: Once | ORAL | Status: DC

## 2023-08-06 MED ORDER — ACETAMINOPHEN 325 MG PO TABS: 650.0000 mg | ORAL_TABLET | ORAL | Status: DC | PRN

## 2023-08-06 MED ORDER — CHLORHEXIDINE GLUCONATE 0.12 % MT SOLN: 15.0000 mL | Freq: Once | OROMUCOSAL | Status: AC

## 2023-08-06 MED ORDER — PHENYLEPHRINE HCL-NACL 20-0.9 MG/250ML-% IV SOLN
INTRAVENOUS | Status: DC | PRN
  Administered 2023-08-06: 40 ug/min via INTRAVENOUS

## 2023-08-06 MED ORDER — DOCUSATE SODIUM 100 MG PO CAPS
100.0000 mg | ORAL_CAPSULE | Freq: Two times a day (BID) | ORAL | Status: DC
  Administered 2023-08-06 – 2023-08-07 (×3): 100 mg via ORAL
  Filled 2023-08-06 (×3): qty 1

## 2023-08-06 MED ORDER — PANTOPRAZOLE SODIUM 40 MG PO TBEC
40.0000 mg | DELAYED_RELEASE_TABLET | Freq: Every day | ORAL | Status: DC
  Administered 2023-08-07: 40 mg via ORAL
  Filled 2023-08-06: qty 1

## 2023-08-06 MED ORDER — HYDROMORPHONE HCL 1 MG/ML IJ SOLN
0.2500 mg | INTRAMUSCULAR | Status: DC | PRN
  Administered 2023-08-06 (×2): 0.5 mg via INTRAVENOUS

## 2023-08-06 MED ORDER — SODIUM CHLORIDE 0.9% FLUSH
3.0000 mL | Freq: Two times a day (BID) | INTRAVENOUS | Status: DC
  Administered 2023-08-06: 3 mL via INTRAVENOUS

## 2023-08-06 MED ORDER — METHOCARBAMOL 500 MG PO TABS
500.0000 mg | ORAL_TABLET | Freq: Four times a day (QID) | ORAL | Status: DC | PRN
  Administered 2023-08-06 (×2): 500 mg via ORAL
  Filled 2023-08-06: qty 1

## 2023-08-06 MED ORDER — METHOCARBAMOL 500 MG PO TABS
ORAL_TABLET | ORAL | Status: AC
  Filled 2023-08-06: qty 1

## 2023-08-06 MED ORDER — OXYCODONE-ACETAMINOPHEN 5-325 MG PO TABS
1.0000 | ORAL_TABLET | Freq: Four times a day (QID) | ORAL | Status: DC | PRN
  Administered 2023-08-06: 2 via ORAL
  Filled 2023-08-06: qty 2

## 2023-08-06 MED ORDER — SUFENTANIL CITRATE 50 MCG/ML IV SOLN
1.0000 ug/kg/h | Freq: Once | INTRAVENOUS | Status: DC
  Filled 2023-08-06: qty 1

## 2023-08-06 MED ORDER — METHOCARBAMOL 1000 MG/10ML IJ SOLN: 500.0000 mg | Freq: Four times a day (QID) | INTRAVENOUS | Status: DC | PRN

## 2023-08-06 MED ORDER — HYDROMORPHONE HCL 1 MG/ML IJ SOLN
INTRAMUSCULAR | Status: AC
  Filled 2023-08-06: qty 1

## 2023-08-06 MED ORDER — AMISULPRIDE (ANTIEMETIC) 5 MG/2ML IV SOLN
INTRAVENOUS | Status: AC
  Filled 2023-08-06: qty 2

## 2023-08-06 MED ORDER — CHLORHEXIDINE GLUCONATE 0.12 % MT SOLN
OROMUCOSAL | Status: AC
  Administered 2023-08-06: 15 mL via OROMUCOSAL
  Filled 2023-08-06: qty 15

## 2023-08-06 MED ORDER — SCOPOLAMINE 1 MG/3DAYS TD PT72
1.0000 | MEDICATED_PATCH | Freq: Once | TRANSDERMAL | Status: DC
  Administered 2023-08-06: 1.5 mg via TRANSDERMAL
  Filled 2023-08-06: qty 1

## 2023-08-06 MED ORDER — FENTANYL CITRATE (PF) 250 MCG/5ML IJ SOLN
INTRAMUSCULAR | Status: DC | PRN
  Administered 2023-08-06: 50 ug via INTRAVENOUS
  Administered 2023-08-06: 100 ug via INTRAVENOUS

## 2023-08-06 MED ORDER — LIDOCAINE-EPINEPHRINE 1 %-1:100000 IJ SOLN
INTRAMUSCULAR | Status: AC
  Filled 2023-08-06: qty 1

## 2023-08-06 MED ORDER — RISAQUAD PO CAPS
1.0000 | ORAL_CAPSULE | Freq: Every day | ORAL | Status: DC
  Administered 2023-08-07: 1 via ORAL
  Filled 2023-08-06: qty 1

## 2023-08-06 MED ORDER — CHLORHEXIDINE GLUCONATE CLOTH 2 % EX PADS: 6.0000 | MEDICATED_PAD | Freq: Once | CUTANEOUS | Status: DC

## 2023-08-06 MED ORDER — OXYCODONE-ACETAMINOPHEN 5-325 MG PO TABS
ORAL_TABLET | ORAL | Status: AC
  Filled 2023-08-06: qty 2

## 2023-08-06 MED ORDER — LORATADINE 10 MG PO TABS
10.0000 mg | ORAL_TABLET | Freq: Every day | ORAL | Status: DC
  Administered 2023-08-07: 10 mg via ORAL
  Filled 2023-08-06: qty 1

## 2023-08-06 MED ORDER — IRBESARTAN 150 MG PO TABS
300.0000 mg | ORAL_TABLET | Freq: Every day | ORAL | Status: DC
  Filled 2023-08-06: qty 2

## 2023-08-06 MED ORDER — ONDANSETRON HCL 4 MG PO TABS: 4.0000 mg | ORAL_TABLET | Freq: Four times a day (QID) | ORAL | Status: DC | PRN

## 2023-08-06 MED ORDER — PROMETHAZINE HCL 25 MG PO TABS: 25.0000 mg | ORAL_TABLET | Freq: Four times a day (QID) | ORAL | Status: DC | PRN

## 2023-08-06 MED ORDER — BUPIVACAINE HCL (PF) 0.5 % IJ SOLN
INTRAMUSCULAR | Status: AC
  Filled 2023-08-06: qty 30

## 2023-08-06 MED ORDER — POTASSIUM CHLORIDE CRYS ER 20 MEQ PO TBCR
20.0000 meq | EXTENDED_RELEASE_TABLET | Freq: Every day | ORAL | Status: DC
  Administered 2023-08-07: 20 meq via ORAL
  Filled 2023-08-06: qty 1

## 2023-08-06 MED ORDER — SENNA 8.6 MG PO TABS
1.0000 | ORAL_TABLET | Freq: Two times a day (BID) | ORAL | Status: DC
  Administered 2023-08-06 – 2023-08-07 (×3): 8.6 mg via ORAL
  Filled 2023-08-06 (×3): qty 1

## 2023-08-06 MED ORDER — BISACODYL 10 MG RE SUPP: 10.0000 mg | Freq: Every day | RECTAL | Status: DC | PRN

## 2023-08-06 MED ORDER — ALBUMIN HUMAN 5 % IV SOLN
INTRAVENOUS | Status: DC | PRN
Start: 2023-08-06 — End: 2023-08-06

## 2023-08-06 MED ORDER — OXYCODONE-ACETAMINOPHEN 5-325 MG PO TABS
1.0000 | ORAL_TABLET | ORAL | Status: DC | PRN
  Administered 2023-08-06 – 2023-08-07 (×4): 2 via ORAL
  Filled 2023-08-06 (×3): qty 2

## 2023-08-06 MED ORDER — ORAL CARE MOUTH RINSE: 15.0000 mL | Freq: Once | OROMUCOSAL | Status: AC

## 2023-08-06 MED ORDER — EPHEDRINE SULFATE-NACL 50-0.9 MG/10ML-% IV SOSY
PREFILLED_SYRINGE | INTRAVENOUS | Status: DC | PRN
  Administered 2023-08-06 (×2): 5 mg via INTRAVENOUS
  Administered 2023-08-06: 15 mg via INTRAVENOUS
  Administered 2023-08-06: 10 mg via INTRAVENOUS

## 2023-08-06 MED ORDER — PROPOFOL 10 MG/ML IV BOLUS
INTRAVENOUS | Status: AC
  Filled 2023-08-06: qty 20

## 2023-08-06 MED ORDER — THROMBIN 5000 UNITS EX SOLR
OROMUCOSAL | Status: DC | PRN
  Administered 2023-08-06: 5 mL via TOPICAL

## 2023-08-06 MED ORDER — ONDANSETRON HCL 4 MG/2ML IJ SOLN
INTRAMUSCULAR | Status: DC | PRN
Start: 2023-08-06 — End: 2023-08-06
  Administered 2023-08-06: 4 mg via INTRAVENOUS

## 2023-08-06 MED ORDER — LIDOCAINE-EPINEPHRINE 1 %-1:100000 IJ SOLN
INTRAMUSCULAR | Status: DC | PRN
  Administered 2023-08-06: 5 mL

## 2023-08-06 MED ORDER — DEXAMETHASONE SODIUM PHOSPHATE 10 MG/ML IJ SOLN
INTRAMUSCULAR | Status: DC | PRN
  Administered 2023-08-06: 10 mg via INTRAVENOUS

## 2023-08-06 MED ORDER — SODIUM CHLORIDE 0.9% FLUSH: 3.0000 mL | INTRAVENOUS | Status: DC | PRN

## 2023-08-06 MED ORDER — SODIUM CHLORIDE 0.9 % IV SOLN: 250.0000 mL | INTRAVENOUS | Status: DC

## 2023-08-06 MED ORDER — EPHEDRINE 5 MG/ML INJ
INTRAVENOUS | Status: AC
  Filled 2023-08-06: qty 5

## 2023-08-06 MED ORDER — PHENOL 1.4 % MT LIQD: 1.0000 | OROMUCOSAL | Status: DC | PRN

## 2023-08-06 MED ORDER — TELMISARTAN-HCTZ 80-25 MG PO TABS: 1.0000 | ORAL_TABLET | Freq: Every day | ORAL | Status: DC

## 2023-08-06 MED ORDER — BUPIVACAINE HCL (PF) 0.5 % IJ SOLN
INTRAMUSCULAR | Status: DC | PRN
  Administered 2023-08-06: 20 mL
  Administered 2023-08-06: 5 mL

## 2023-08-06 MED ORDER — CEFAZOLIN SODIUM-DEXTROSE 2-4 GM/100ML-% IV SOLN
2.0000 g | INTRAVENOUS | Status: AC
  Administered 2023-08-06: 2 g via INTRAVENOUS

## 2023-08-06 MED ORDER — MENTHOL 3 MG MT LOZG: 1.0000 | LOZENGE | OROMUCOSAL | Status: DC | PRN

## 2023-08-06 MED ORDER — THROMBIN 5000 UNITS EX SOLR
CUTANEOUS | Status: AC
  Filled 2023-08-06: qty 5000

## 2023-08-06 MED ORDER — ALUM & MAG HYDROXIDE-SIMETH 200-200-20 MG/5ML PO SUSP: 30.0000 mL | Freq: Four times a day (QID) | ORAL | Status: DC | PRN

## 2023-08-06 MED ORDER — HYDROCHLOROTHIAZIDE 25 MG PO TABS
25.0000 mg | ORAL_TABLET | Freq: Every day | ORAL | Status: DC
  Filled 2023-08-06: qty 1

## 2023-08-06 MED ORDER — POLYETHYLENE GLYCOL 3350 17 G PO PACK: 17.0000 g | PACK | Freq: Every day | ORAL | Status: DC | PRN

## 2023-08-06 MED ORDER — SUGAMMADEX SODIUM 200 MG/2ML IV SOLN
INTRAVENOUS | Status: DC | PRN
  Administered 2023-08-06: 400 mg via INTRAVENOUS

## 2023-08-06 MED ORDER — ONDANSETRON HCL 4 MG/2ML IJ SOLN: 4.0000 mg | Freq: Four times a day (QID) | INTRAMUSCULAR | Status: DC | PRN

## 2023-08-06 MED ORDER — MORPHINE SULFATE (PF) 2 MG/ML IV SOLN
2.0000 mg | INTRAVENOUS | Status: DC | PRN
  Administered 2023-08-06: 2 mg via INTRAVENOUS
  Filled 2023-08-06: qty 1

## 2023-08-06 SURGICAL SUPPLY — 44 items
ADH SKN CLS APL DERMABOND .7 (GAUZE/BANDAGES/DRESSINGS) ×1
BAG COUNTER SPONGE SURGICOUNT (BAG) ×1 IMPLANT
BAG SPNG CNTER NS LX DISP (BAG) ×1
BLADE CLIPPER SURG (BLADE) IMPLANT
BOLT PLATE XLIF 5.0X55 ST (Bolt) IMPLANT
BOLT PLATE XLIF 5.5X55 LRG (Bolt) IMPLANT
BONE MATRIX OSTEOCEL PRO LRG (Bone Implant) IMPLANT
DERMABOND ADVANCED .7 DNX12 (GAUZE/BANDAGES/DRESSINGS) ×1 IMPLANT
DRAPE C-ARM 42X72 X-RAY (DRAPES) ×1 IMPLANT
DRAPE C-ARMOR (DRAPES) ×1 IMPLANT
DRAPE LAPAROTOMY 100X72X124 (DRAPES) ×1 IMPLANT
DRSG OPSITE POSTOP 4X6 (GAUZE/BANDAGES/DRESSINGS) IMPLANT
DURAPREP 26ML APPLICATOR (WOUND CARE) ×1 IMPLANT
ELECT REM PT RETURN 9FT ADLT (ELECTROSURGICAL) ×1
ELECTRODE REM PT RTRN 9FT ADLT (ELECTROSURGICAL) ×1 IMPLANT
GAUZE 4X4 16PLY ~~LOC~~+RFID DBL (SPONGE) IMPLANT
GLOVE BIOGEL PI IND STRL 8.5 (GLOVE) ×1 IMPLANT
GLOVE ECLIPSE 8.5 STRL (GLOVE) ×1 IMPLANT
GLOVE EXAM NITRILE XL STR (GLOVE) IMPLANT
GOWN STRL REUS W/ TWL LRG LVL3 (GOWN DISPOSABLE) IMPLANT
GOWN STRL REUS W/ TWL XL LVL3 (GOWN DISPOSABLE) ×1 IMPLANT
GOWN STRL REUS W/TWL 2XL LVL3 (GOWN DISPOSABLE) ×1 IMPLANT
GOWN STRL REUS W/TWL LRG LVL3 (GOWN DISPOSABLE)
GOWN STRL REUS W/TWL XL LVL3 (GOWN DISPOSABLE) ×1
HEMOSTAT POWDER KIT SURGIFOAM (HEMOSTASIS) IMPLANT
KIT BASIN OR (CUSTOM PROCEDURE TRAY) ×1 IMPLANT
KIT DILATOR XLIF 5 (KITS) IMPLANT
KIT SURGICAL ACCESS MAXCESS 4 (KITS) IMPLANT
KIT TURNOVER KIT B (KITS) ×1 IMPLANT
MODULE NVM5 NEXT GEN EMG (NEUROSURGERY SUPPLIES) IMPLANT
MODULUS XLW 12X22X55MM 10 (Spine Construct) IMPLANT
NDL HYPO 25X1 1.5 SAFETY (NEEDLE) ×1 IMPLANT
NEEDLE HYPO 25X1 1.5 SAFETY (NEEDLE) ×1 IMPLANT
NS IRRIG 1000ML POUR BTL (IV SOLUTION) ×1 IMPLANT
PACK LAMINECTOMY NEURO (CUSTOM PROCEDURE TRAY) ×1 IMPLANT
PLATE DECADE 4HOLE SZ12 XLIF (Plate) IMPLANT
SPONGE T-LAP 4X18 ~~LOC~~+RFID (SPONGE) IMPLANT
SUT VIC AB 3-0 SH 8-18 (SUTURE) ×1 IMPLANT
SUT VIC AB 4-0 RB1 18 (SUTURE) ×1 IMPLANT
TAPE CLOTH 4X10 WHT NS (GAUZE/BANDAGES/DRESSINGS) ×2 IMPLANT
TOWEL GREEN STERILE (TOWEL DISPOSABLE) ×1 IMPLANT
TOWEL GREEN STERILE FF (TOWEL DISPOSABLE) ×1 IMPLANT
TRAY FOLEY MTR SLVR 16FR STAT (SET/KITS/TRAYS/PACK) ×1 IMPLANT
WATER STERILE IRR 1000ML POUR (IV SOLUTION) ×1 IMPLANT

## 2023-08-06 NOTE — Anesthesia Preprocedure Evaluation (Addendum)
Anesthesia Evaluation  Patient identified by MRN, date of birth, ID band Patient awake    Reviewed: Allergy & Precautions, H&P , NPO status , Patient's Chart, lab work & pertinent test results  Airway Mallampati: III  TM Distance: >3 FB Neck ROM: Full    Dental no notable dental hx. (+) Teeth Intact, Dental Advisory Given   Pulmonary neg pulmonary ROS   Pulmonary exam normal breath sounds clear to auscultation       Cardiovascular hypertension, Pt. on medications  Rhythm:Regular Rate:Normal     Neuro/Psych  Headaches  negative psych ROS   GI/Hepatic Neg liver ROS,GERD  Medicated,,  Endo/Other  negative endocrine ROS    Renal/GU Renal InsufficiencyRenal disease  negative genitourinary   Musculoskeletal   Abdominal   Peds  Hematology negative hematology ROS (+)   Anesthesia Other Findings   Reproductive/Obstetrics negative OB ROS                             Anesthesia Physical Anesthesia Plan  ASA: 2  Anesthesia Plan: General   Post-op Pain Management: Tylenol PO (pre-op)*   Induction: Intravenous  PONV Risk Score and Plan: 3 and Ondansetron, Dexamethasone, Treatment may vary due to age or medical condition and Scopolamine patch - Pre-op  Airway Management Planned: Oral ETT  Additional Equipment:   Intra-op Plan:   Post-operative Plan: Extubation in OR  Informed Consent: I have reviewed the patients History and Physical, chart, labs and discussed the procedure including the risks, benefits and alternatives for the proposed anesthesia with the patient or authorized representative who has indicated his/her understanding and acceptance.     Dental advisory given  Plan Discussed with: CRNA  Anesthesia Plan Comments:        Anesthesia Quick Evaluation

## 2023-08-06 NOTE — Transfer of Care (Signed)
Immediate Anesthesia Transfer of Care Note  Patient: Blake Cox  Procedure(s) Performed: Anterior Lateral Lumbar Interbody Fusion, Lumbar Two-Three  Patient Location: PACU  Anesthesia Type:General  Level of Consciousness: awake and alert   Airway & Oxygen Therapy: Patient Spontanous Breathing  Post-op Assessment: Report given to RN and Post -op Vital signs reviewed and stable  Post vital signs: Reviewed and stable  Last Vitals:  Vitals Value Taken Time  BP 123/81 08/06/23 1445  Temp    Pulse 81 08/06/23 1449  Resp 13 08/06/23 1449  SpO2 92 % 08/06/23 1449  Vitals shown include unfiled device data.  Last Pain:  Vitals:   08/06/23 0928  TempSrc:   PainSc: 0-No pain         Complications: No notable events documented.

## 2023-08-06 NOTE — Op Note (Signed)
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  History of fusion L3-L5.  Postoperative diagnosis: Same Procedure: Intralateral decompression L2-L3 with XLIF technique arthrodesis with allograft and XLIF spacer lateral plate fixation [id number] fluoroscopic image guidance and EMG guidance intraoperatively. Surgeon: Barnett Abu Anesthesia: General endotracheal Indications: Blake Cox is a 72 year old individual whose had significant bilateral radicular pain with weakness in his proximal lower extremities particularly the iliopsoas and the quads.  He has had previous surgical decompression at L3-4 and L4-5 secondary to stenosis and has developed adjacent level disease at L2-3 above his fusion.  He was advised regarding the need for surgical decompression and stabilization using an XLIF technique.  Procedure: Patient was brought to the operating room supine on the stretcher.  After the smooth induction of general endotracheal anesthesia, he was carefully turned to the lateral decubitus position with the right side up.  Fluoroscopic guidance was then used to localize and check position of the L2-3 interspace and make sure it was orthogonal to the planes of projection.  The skin was marked in the lateral aspect.  Skin was then cleansed with alcohol DuraPrep and draped in a sterile fashion.  An incision was made over the lateral aspect that had been drawn out and the dissection was carried down to the fascia.  A second incision was made posteriorly the paraspinous fascia and the retroperitoneal space was approached bluntly using a Kelly clamp enlarging the opening to allow passage of the finger.  Then by using a probe through the lateral fascia into the retroperitoneal space we carefully guided the probe over the psoas muscle.  Using fluoroscopic guidance the probe was placed in the midportion of her vertebrae at L2-L3.  EMG stimulation  was then performed to make sure no branches of the lumbar plexus were nearby.  A K wire was then passed into the disc space to anchor the tube.  A series of dilators were then placed over this each time checking for EMG activity from the lumbar plexus.  When none was noted ultimately 120 mm deep retractor was placed over the outer cannula and this was also anchored to the patient with a external clamp.  Again EMG stimulation near the posterior aspect of the retractor was obtained to make sure that placement of a shim would not impede any neurofunction.  Chane was placed and then the retractor was gradually opened and the lateral aspect of the disc space was uncovered at L2-L3.  By incising the disc we then entered the disc space and remove the substantial quantity of severely degenerated desiccated disc material.  The space was evacuated of this material using a series of shavers rongeurs and curettes.  The interspace was opened to allow passage of initially an 8 mm tall spacer measuring 18 mm in AP diameter.  The contralateral ligament was opened at this point using Cobb elevators initially and then using the dilators progressively.  Ultimately was felt that a 22 mm spacer in the AP dimension could be placed and this measured 12 mm in height with 10 degrees of lordosis.  The endplates were rongeured and all the endplate material was removed through this aperture.  The contralateral ligament was opened adequately and when readied the interspace was filled with some allograft in the form of Osteocel and then under fluoroscopic visualization we placed the titanium spacer.  Once position was verified and noted to be good the retractor was opened slightly further to allow placement  of a 12 mm tall lateral plate.  This was secured with 55 mm screws ventrally and dorsally in the plate.  It was closed tightly then and final radiographs were obtained after removal of the retractor.  No significant hemorrhage was noted from  the surgical site.  Then the fascia was closed with 3-0 Vicryl in interrupted fashion and 3-0 and 4-0 Vicryl was used in the subcuticular skin in both incisions.  Dermabond was placed on the skin and a dry sterile dressing was placed over this.  Patient was then returned to the supine position.  No abnormal EMG activity was encountered during this entire process.  Was estimated less than 50 cc for the process.

## 2023-08-06 NOTE — H&P (Signed)
Blake Cox is an 72 y.o. male.   Chief Complaint: Back and bilateral proximal leg pain for over 6 months HPI: Patient is a 72 year old individuals had a previous decompression fusion at L3-4 and then L4-5 he has significant stenosis that was initially decompressed via laminotomies and foraminotomies however in the last year he is developed significant adjacent level disease at L2-3 with diastases of his facets marked collapse of the disc space and anterolisthesis.  He has been advised regarding surgical decompression and arthrodesis of L2-L3 and he is now being admitted for an anterolateral indirect decompression and stabilization via lateral plate technique.  Past Medical History:  Diagnosis Date   Borderline high cholesterol    Chronic kidney disease    per PCP note- pt unsure   Complication of anesthesia    "hard to come out of anesthesia"   GERD (gastroesophageal reflux disease)    Headache    migraines- worse in the past   Hypertension    Pre-diabetes     Past Surgical History:  Procedure Laterality Date   BACK SURGERY     Dr Danielle Dess october 2023   CARPAL TUNNEL RELEASE Right    CERVICAL SPINE SURGERY  12/2022   Dr Danielle Dess   dental implants     INJECTION KNEE Right     History reviewed. No pertinent family history. Social History:  reports that he has never smoked. He has never used smokeless tobacco. He reports that he does not drink alcohol and does not use drugs.  Allergies:  Allergies  Allergen Reactions   Toprol Xl [Metoprolol] Rash    Medications Prior to Admission  Medication Sig Dispense Refill   acetaminophen (TYLENOL) 650 MG CR tablet Take 1,300 mg by mouth every 8 (eight) hours as needed for pain.     Ascorbic Acid (VITAMIN C) 1000 MG tablet Take 1,000 mg by mouth daily.     cetirizine (ZYRTEC) 10 MG tablet Take 10 mg by mouth daily.     Cholecalciferol (DIALYVITE VITAMIN D 5000) 125 MCG (5000 UT) capsule Take 5,000 Units by mouth daily.      cyclobenzaprine (FLEXERIL) 10 MG tablet Take 10 mg by mouth at bedtime.     DEXILANT 60 MG capsule Take 60 mg by mouth daily.     ibuprofen (ADVIL) 800 MG tablet Take 800 mg by mouth 3 (three) times daily as needed for moderate pain.     potassium chloride (KLOR-CON M) 10 MEQ tablet Take 20 mEq by mouth daily.     Probiotic, Lactobacillus, CAPS Take 1 capsule by mouth daily.     rosuvastatin (CRESTOR) 20 MG tablet Take 20 mg by mouth daily.     sildenafil (REVATIO) 20 MG tablet Take 40-60 mg by mouth daily as needed (ED).     telmisartan-hydrochlorothiazide (MICARDIS HCT) 80-25 MG tablet Take 1 tablet by mouth daily.     Turmeric 500 MG CAPS Take 1,000 mg by mouth daily.     Zinc 50 MG TABS Take 50 mg by mouth daily.     LORazepam (ATIVAN) 0.5 MG tablet Take 0.5 mg by mouth at bedtime as needed for sleep or anxiety.     promethazine (PHENERGAN) 25 MG tablet Take 25 mg by mouth every 6 (six) hours as needed for vomiting or nausea.      No results found for this or any previous visit (from the past 48 hour(s)). No results found.  Review of Systems  Constitutional:  Positive for activity change.  Musculoskeletal:  Positive for back pain, gait problem and myalgias.  Neurological:  Positive for weakness and numbness.  All other systems reviewed and are negative.   Blood pressure 137/83, pulse 70, temperature 97.7 F (36.5 C), temperature source Oral, resp. rate 18, height 5\' 10"  (1.778 m), weight 83.9 kg, SpO2 96%. Physical Exam Constitutional:      Appearance: Normal appearance.  HENT:     Head: Normocephalic and atraumatic.     Right Ear: Tympanic membrane, ear canal and external ear normal.     Left Ear: Tympanic membrane, ear canal and external ear normal.     Nose: Nose normal.     Mouth/Throat:     Mouth: Mucous membranes are moist.     Pharynx: Oropharynx is clear.  Eyes:     Extraocular Movements: Extraocular movements intact.     Conjunctiva/sclera: Conjunctivae normal.      Pupils: Pupils are equal, round, and reactive to light.  Cardiovascular:     Rate and Rhythm: Normal rate and regular rhythm.     Pulses: Normal pulses.     Heart sounds: Normal heart sounds.  Pulmonary:     Effort: Pulmonary effort is normal.     Breath sounds: Normal breath sounds.  Abdominal:     General: Abdomen is flat. Bowel sounds are normal.     Palpations: Abdomen is soft.  Musculoskeletal:     Cervical back: Normal range of motion and neck supple.     Comments: Leg raising at 30 degrees either lower extremity.  Patrick's maneuver is negative bilaterally.  Skin:    General: Skin is warm and dry.     Capillary Refill: Capillary refill takes less than 2 seconds.  Neurological:     Mental Status: He is alert. Mental status is at baseline.     Comments: Mild iliopsoas weakness at 4 out of 5 mild quadriceps weakness of 4 out of 5 patellar reflexes trace bilaterally Achilles reflex absent bilaterally.  Psychiatric:        Mood and Affect: Mood normal.        Behavior: Behavior normal.        Thought Content: Thought content normal.        Judgment: Judgment normal.      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  Plan: Anterolateral decompression using an XLIF technique L2-L3 with lateral plate fixation.  Arthrodesis with allograft.  Stefani Dama, MD 08/06/2023, 12:09 PM

## 2023-08-06 NOTE — Anesthesia Postprocedure Evaluation (Signed)
Anesthesia Post Note  Patient: Blake Cox  Procedure(s) Performed: Anterior Lateral Lumbar Interbody Fusion, Lumbar Two-Three     Patient location during evaluation: PACU Anesthesia Type: General Level of consciousness: awake and alert Pain management: pain level controlled Vital Signs Assessment: post-procedure vital signs reviewed and stable Respiratory status: spontaneous breathing, nonlabored ventilation and respiratory function stable Cardiovascular status: blood pressure returned to baseline and stable Postop Assessment: no apparent nausea or vomiting Anesthetic complications: no  No notable events documented.  Last Vitals:  Vitals:   08/06/23 1515 08/06/23 1530  BP: 112/73 112/76  Pulse: 68 72  Resp: 12 14  Temp:  36.6 C  SpO2: 97% 95%    Last Pain:  Vitals:   08/06/23 1515  TempSrc:   PainSc: 4                  Balbina Depace,W. EDMOND

## 2023-08-06 NOTE — Anesthesia Procedure Notes (Signed)
Procedure Name: Intubation Date/Time: 08/06/2023 12:40 PM  Performed by: Sandie Ano, CRNAPre-anesthesia Checklist: Patient identified, Emergency Drugs available, Suction available and Patient being monitored Patient Re-evaluated:Patient Re-evaluated prior to induction Oxygen Delivery Method: Circle System Utilized Preoxygenation: Pre-oxygenation with 100% oxygen Induction Type: IV induction Ventilation: Mask ventilation without difficulty Laryngoscope Size: Mac and 3 Grade View: Grade II Tube type: Oral Number of attempts: 1 Airway Equipment and Method: Stylet and Oral airway Placement Confirmation: ETT inserted through vocal cords under direct vision, positive ETCO2 and breath sounds checked- equal and bilateral Secured at: 23 cm Tube secured with: Tape Dental Injury: Teeth and Oropharynx as per pre-operative assessment

## 2023-08-07 ENCOUNTER — Encounter (HOSPITAL_COMMUNITY): Payer: Self-pay | Admitting: Neurological Surgery

## 2023-08-07 MED ORDER — CYCLOBENZAPRINE HCL 10 MG PO TABS: 10.0000 mg | ORAL_TABLET | Freq: Three times a day (TID) | ORAL | 3 refills | Status: AC | PRN

## 2023-08-07 MED ORDER — OXYCODONE-ACETAMINOPHEN 5-325 MG PO TABS: 1.0000 | ORAL_TABLET | Freq: Four times a day (QID) | ORAL | 0 refills | Status: AC | PRN

## 2023-08-07 NOTE — Evaluation (Signed)
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 And Radiculopathy.Pt underwent anterolateral indirect decompression and stabilization at L2-3. PMH: Previous decompression fusion L3-L5.   Clinical Impression   Pt admitted for above, he recalls 4/4 POB precautions/restrictions. During dressing he required 2 verbal cues to prevent from bending, his wife would also cue him as well. Pt completing ADLs with Setup to Mod I, LBD was more of a challenge due to pain on R incision site with lifting his RLE. Pt has no further acute skilled OT needs and overall did well with his back precautions. No follow-up OT recommended at this time.       If plan is discharge home, recommend the following: Assistance with cooking/housework;Assist for transportation    Functional Status Assessment  Patient has had a recent decline in their functional status and demonstrates the ability to make significant improvements in function in a reasonable and predictable amount of time.  Equipment Recommendations  None recommended by OT    Recommendations for Other Services       Precautions / Restrictions Precautions Precautions: Back Precaution Booklet Issued: Yes (comment) Precaution Comments: pt and spouse with good understanding, familar from previous back surgeries, pt also with report of dizziness at 5/10 however reports this to have been an issue before surgery Required Braces or Orthoses: Spinal Brace Spinal Brace: Applied in sitting position;Thoracolumbosacral orthotic Restrictions Weight Bearing Restrictions: No      Mobility Bed Mobility Overal bed mobility: Modified Independent             General bed mobility comments: using log roll technique    Transfers Overall transfer level: Needs assistance Equipment  used: None Transfers: Sit to/from Stand Sit to Stand: Modified independent (Device/Increase time)           General transfer comment: STS x3 no AD      Balance Overall balance assessment: Needs assistance Sitting-balance support: Feet supported, No upper extremity supported Sitting balance-Leahy Scale: Good     Standing balance support: During functional activity Standing balance-Leahy Scale: Fair Standing balance comment: benefits from RW for dynamic tasks/walking                           ADL either performed or assessed with clinical judgement   ADL Overall ADL's : Needs assistance/impaired Eating/Feeding: Independent;Sitting   Grooming: Independent   Upper Body Bathing: Independent   Lower Body Bathing: Supervison/ safety;Sitting/lateral leans;Set up Lower Body Bathing Details (indicate cue type and reason): discussed use of step stool if needed Upper Body Dressing : Modified independent;Standing   Lower Body Dressing: Sitting/lateral leans;Supervision/safety;Set up Lower Body Dressing Details (indicate cue type and reason): Discussed with pt compensatory strategy to use a stool or prop RLE on bed for support, bed propping was better Toilet Transfer: Modified Independent   Toileting- Clothing Manipulation and Hygiene: Supervision/safety;Sit to/from stand         General ADL Comments: Donned brace in sitting     Vision         Perception         Praxis         Pertinent Vitals/Pain Pain Assessment Pain Assessment: 0-10 Pain Score: 3  Pain Location: lower back, not at incision Pain Descriptors / Indicators: Burning, Sore Pain Intervention(s): Limited activity within patient's tolerance  Extremity/Trunk Assessment Upper Extremity Assessment Upper Extremity Assessment: Overall WFL for tasks assessed   Lower Extremity Assessment Lower Extremity Assessment: Generalized weakness   Cervical / Trunk Assessment Cervical / Trunk  Assessment: Back Surgery   Communication Communication Communication: No apparent difficulties   Cognition Arousal: Alert Behavior During Therapy: WFL for tasks assessed/performed Overall Cognitive Status: Within Functional Limits for tasks assessed                                       General Comments  VSS    Exercises     Shoulder Instructions      Home Living Family/patient expects to be discharged to:: Private residence Living Arrangements: Spouse/significant other Available Help at Discharge: Family;Available 24 hours/day Type of Home: House Home Access: Stairs to enter [employer] of Steps: 2 Entrance Stairs-Rails: Right Home Layout: One level     Bathroom Shower/Tub: Producer, television/film/video: Handicapped height Bathroom Accessibility: Yes   Home Equipment: Agricultural consultant (2 wheels);Hand held shower head;Shower seat;Grab bars - tub/shower          Prior Functioning/Environment Prior Level of Function : Independent/Modified Independent;Driving             Mobility Comments: amb without AD however limited due to onset of bilat hip pain ADLs Comments: indep        OT Problem List: Pain      OT Treatment/Interventions:      OT Goals(Current goals can be found in the care plan section) Acute Rehab OT Goals Patient Stated Goal: To go home OT Goal Formulation: With patient Time For Goal Achievement: 08/21/23 Potential to Achieve Goals: Good  OT Frequency:      Co-evaluation              AM-PAC OT "6 Clicks" Daily Activity     Outcome Measure Help from another person eating meals?: None Help from another person taking care of personal grooming?: A Little Help from another person toileting, which includes using toliet, bedpan, or urinal?: A Little Help from another person bathing (including washing, rinsing, drying)?: A Little Help from another person to put on and taking off regular upper body clothing?:  None Help from another person to put on and taking off regular lower body clothing?: A Little 6 Click Score: 20   End of Session Equipment Utilized During Treatment: Back brace Nurse Communication: Mobility status  Activity Tolerance: Patient tolerated treatment well Patient left: in bed;with call bell/phone within reach;with family/visitor present  OT Visit Diagnosis: Pain Pain - part of body:  (back)                Time: 4332-9518 OT Time Calculation (min): 15 min Charges:  OT General Charges $OT Visit: 1 Visit OT Evaluation $OT Eval Low Complexity: 1 Low  08/07/2023  AB, OTR/L  Acute Rehabilitation Services  Office: [phone]   Tristan Schroeder 08/07/2023, 10:41 AM

## 2023-08-07 NOTE — Plan of Care (Signed)
Pt doing well. Pt and wife given D/C instructions with verbal understanding. Rx's were sent to the pharmacy by MD. Pt's incision is clean and dry with no sign of infection. Pt's IV was removed prior to D/C. Pt D/C'd home via wheelchair per MD order. Pt is stable @ D/C and has no other needs at this time. Rema Fendt, RN

## 2023-08-07 NOTE — Discharge Summary (Signed)
Physician Discharge Summary  Patient ID: BUSTER YOHO MRN: [id number] DOB/AGE: May 08, 1951 72 y.o.  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  Radiculopathy.  Previous decompression fusion L3-L5.  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  Lumbar radiculopathy.  Previous decompression and fusion L3-L5. Principal Problem:   Lumbar stenosis with neurogenic claudication   Discharged Condition: good  Hospital Course: Patient was admitted to undergo surgery which he tolerated well.  He was ambulatory incisions are clean and dry.  Consults: None  Significant Diagnostic Studies: None  Treatments: surgery: See op note  Discharge Exam: Blood pressure 110/62, pulse (!) 55, temperature 98.2 F (36.8 C), temperature source Oral, resp. rate 20, height 5\' 10"  (1.778 m), weight 83.9 kg, SpO2 100%. Incision is clean and dry Station and gait are intact.  Disposition: Discharge disposition: 01-Home or Self Care       Discharge Instructions     Call MD for:  redness, tenderness, or signs of infection (pain, swelling, redness, odor or green/yellow discharge around incision site)   Complete by: As directed    Call MD for:  severe uncontrolled pain   Complete by: As directed    Call MD for:  temperature >100.4   Complete by: As directed    Diet - low sodium heart healthy   Complete by: As directed    Discharge instructions   Complete by: As directed    Okay to shower. Do not apply salves or appointments to incision. No heavy lifting with the upper extremities greater than 10 pounds. May resume driving when not requiring pain medication and patient feels comfortable with doing so.   Incentive spirometry RT   Complete by: As directed    Increase activity slowly   Complete by: As directed       Allergies as of 08/07/2023       Reactions   Toprol Xl [metoprolol] Rash         Medication List     TAKE these medications    acetaminophen 650 MG CR tablet Commonly known as: TYLENOL Take 1,300 mg by mouth every 8 (eight) hours as needed for pain.   cetirizine 10 MG tablet Commonly known as: ZYRTEC Take 10 mg by mouth daily.   cyclobenzaprine 10 MG tablet Commonly known as: FLEXERIL Take 1 tablet (10 mg total) by mouth 3 (three) times daily as needed for muscle spasms. What changed:  when to take this reasons to take this   Dexilant 60 MG capsule Generic drug: dexlansoprazole Take 60 mg by mouth daily.   Dialyvite Vitamin D 5000 125 MCG (5000 UT) capsule Generic drug: Cholecalciferol Take 5,000 Units by mouth daily.   ibuprofen 800 MG tablet Commonly known as: ADVIL Take 800 mg by mouth 3 (three) times daily as needed for moderate pain.   LORazepam 0.5 MG tablet Commonly known as: ATIVAN Take 0.5 mg by mouth at bedtime as needed for sleep or anxiety.   oxyCODONE-acetaminophen 5-325 MG tablet Commonly known as: PERCOCET/ROXICET Take 1-2 tablets by mouth every 6 (six) hours as needed for moderate pain or severe pain.   potassium chloride 10 MEQ tablet Commonly known as: KLOR-CON M Take 20 mEq by mouth daily.   Probiotic (Lactobacillus) Caps Take 1 capsule by mouth daily.   promethazine 25 MG tablet Commonly known as: PHENERGAN Take 25 mg by mouth every 6 (six) hours as needed for vomiting or nausea.   rosuvastatin 20 MG tablet  Commonly known as: CRESTOR Take 20 mg by mouth daily.   sildenafil 20 MG tablet Commonly known as: REVATIO Take 40-60 mg by mouth daily as needed (ED).   telmisartan-hydrochlorothiazide 80-25 MG tablet Commonly known as: MICARDIS HCT Take 1 tablet by mouth daily.   Turmeric 500 MG Caps Take 1,000 mg by mouth daily.   vitamin C 1000 MG tablet Take 1,000 mg by mouth daily.   Zinc 50 MG Tabs Take 50 mg by mouth daily.         Signed: Stefani Dama 08/07/2023, 9:17 AM

## 2023-08-07 NOTE — Evaluation (Signed)
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 And Radiculopathy.Pt underwent anterolateral indirect decompression and stabilization at L2-3. PMH: Previous decompression fusion L3-L5.   Clinical Impression  Pt admitted with above. Pt familiar with back precautions and able to recall them due to having 2 previous back surgeries this year. Wife also present with good recall as well. Pt reports 5/10 dizziness but reports this to be chronic.Pt able to amb with RW with CGA and safely  negotiate 3 steps to enter home. Acute PT to cont to follow while in hospital.        If plan is discharge home, recommend the following:     Can travel by private vehicle        Equipment Recommendations None recommended by PT  Recommendations for Other Services       Functional Status Assessment Patient has had a recent decline in their functional status and demonstrates the ability to make significant improvements in function in a reasonable and predictable amount of time.     Precautions / Restrictions Precautions Precautions: Back Precaution Booklet Issued: Yes (comment) Precaution Comments: pt and spouse with good understanding, familar from previous back surgeries, pt also with report of dizziness at 5/10 however reports this to have been an issue before surgery Required Braces or Orthoses: Spinal Brace Spinal Brace: Applied in sitting position Restrictions Weight Bearing Restrictions: No      Mobility  Bed Mobility               General bed mobility comments: pt received sitting EOB. Pt reports using sidelying technique to get in/out of bed    Transfers Overall transfer level: Needs assistance Equipment used: None Transfers: Sit to/from Stand Sit to Stand: Contact guard assist           General  transfer comment: CGA due to report of dizziness (chronic), good technique with pushing up from bed and minimal trunk flexion    Ambulation/Gait Ambulation/Gait assistance: Contact guard assist Gait Distance (Feet): 200 Feet Assistive device: Rolling walker (2 wheels) Gait Pattern/deviations: Step-through pattern, Decreased stride length Gait velocity: dec Gait velocity interpretation: <1.31 ft/sec, indicative of household ambulator   General Gait Details: slower, guarded, reports 5/10 dizziness  Stairs Stairs: Yes Stairs assistance: Contact guard assist Stair Management: One rail Right, Alternating pattern, Forwards Number of Stairs: 3 General stair comments: pt with good technique  Wheelchair Mobility     Tilt Bed    Modified Rankin (Stroke Patients Only)       Balance Overall balance assessment: Needs assistance Sitting-balance support: Feet supported, No upper extremity supported Sitting balance-Leahy Scale: Good     Standing balance support: During functional activity Standing balance-Leahy Scale: Fair Standing balance comment: benefits from RW for dynamic tasks/walking                             Pertinent Vitals/Pain Pain Assessment Pain Assessment: 0-10 Pain Score: 3  Pain Location: lower back, not at incision Pain Descriptors / Indicators: Burning, Sore Pain Intervention(s): Limited activity within patient's tolerance    Home Living Family/patient expects to be discharged to:: Private residence Living Arrangements: Spouse/significant other Available Help at Discharge: Family;Available 24 hours/day Type of Home: House Home Access: Stairs to enter Entrance Stairs-Rails: Right Entrance Stairs-Number of Steps: 2  Home Layout: One level Home Equipment: Agricultural consultant (2 wheels);Hand held shower head;Shower seat;Grab bars - tub/shower      Prior Function Prior Level of Function : Independent/Modified Independent;Driving              Mobility Comments: amb without AD however limited due to onset of bilat hip pain ADLs Comments: indep     Extremity/Trunk Assessment   Upper Extremity Assessment Upper Extremity Assessment: Overall WFL for tasks assessed    Lower Extremity Assessment Lower Extremity Assessment: Generalized weakness    Cervical / Trunk Assessment Cervical / Trunk Assessment: Back Surgery  Communication   Communication Communication: No apparent difficulties  Cognition Arousal: Alert Behavior During Therapy: WFL for tasks assessed/performed Overall Cognitive Status: Within Functional Limits for tasks assessed                                          General Comments General comments (skin integrity, edema, etc.): VSS    Exercises Other Exercises Other Exercises: discussed isometric abdominal exercises, hold for 5-10 seconds in either sitting or supine. No active crunches   Assessment/Plan    PT Assessment Patient needs continued PT services  PT Problem List Decreased strength;Decreased activity tolerance;Decreased balance;Pain       PT Treatment Interventions      PT Goals (Current goals can be found in the Care Plan section)  Acute Rehab PT Goals Patient Stated Goal: no more pain PT Goal Formulation: With patient/family Time For Goal Achievement: 08/21/23 Potential to Achieve Goals: Good    Frequency Min 5X/week     Co-evaluation               AM-PAC PT "6 Clicks" Mobility  Outcome Measure Help needed turning from your back to your side while in a flat bed without using bedrails?: None Help needed moving from lying on your back to sitting on the side of a flat bed without using bedrails?: None Help needed moving to and from a bed to a chair (including a wheelchair)?: None Help needed standing up from a chair using your arms (e.g., wheelchair or bedside chair)?: None Help needed to walk in hospital room?: A Little Help needed climbing 3-5 steps with  a railing? : A Little 6 Click Score: 22    End of Session Equipment Utilized During Treatment: Gait belt;Back brace Activity Tolerance: Patient tolerated treatment well Patient left: with family/visitor present (sitting EOB) Nurse Communication: Mobility status PT Visit Diagnosis: Pain;Difficulty in walking, not elsewhere classified (R26.2) Pain - Right/Left:  (back)    Time: 7829-5621 PT Time Calculation (min) (ACUTE ONLY): 23 min   Charges:   PT Evaluation $PT Eval Moderate Complexity: 1 Mod PT Treatments $Gait Training: 8-22 mins PT General Charges $$ ACUTE PT VISIT: 1 Visit         Lewis Shock, PT, DPT Acute Rehabilitation Services Secure chat preferred Office #: [phone]   Iona Hansen 08/07/2023, 9:54 AM

## 2023-08-23 DIAGNOSIS — Z6827 Body mass index (BMI) 27.0-27.9, adult: Secondary | ICD-10-CM | POA: Diagnosis not present

## 2023-08-23 DIAGNOSIS — M48061 Spinal stenosis, lumbar region without neurogenic claudication: Secondary | ICD-10-CM | POA: Diagnosis not present

## 2023-08-23 DIAGNOSIS — M4150 Other secondary scoliosis, site unspecified: Secondary | ICD-10-CM | POA: Diagnosis not present

## 2023-09-25 DIAGNOSIS — E782 Mixed hyperlipidemia: Secondary | ICD-10-CM | POA: Diagnosis not present

## 2023-09-25 DIAGNOSIS — R7303 Prediabetes: Secondary | ICD-10-CM | POA: Diagnosis not present

## 2023-09-25 DIAGNOSIS — N182 Chronic kidney disease, stage 2 (mild): Secondary | ICD-10-CM | POA: Diagnosis not present

## 2023-09-25 DIAGNOSIS — G8929 Other chronic pain: Secondary | ICD-10-CM | POA: Diagnosis not present

## 2023-09-25 DIAGNOSIS — M5440 Lumbago with sciatica, unspecified side: Secondary | ICD-10-CM | POA: Diagnosis not present

## 2023-09-25 DIAGNOSIS — F419 Anxiety disorder, unspecified: Secondary | ICD-10-CM | POA: Diagnosis not present

## 2023-09-25 DIAGNOSIS — I10 Essential (primary) hypertension: Secondary | ICD-10-CM | POA: Diagnosis not present

## 2023-10-01 DIAGNOSIS — I10 Essential (primary) hypertension: Secondary | ICD-10-CM | POA: Diagnosis not present

## 2023-10-01 DIAGNOSIS — F419 Anxiety disorder, unspecified: Secondary | ICD-10-CM | POA: Diagnosis not present

## 2023-10-01 DIAGNOSIS — N182 Chronic kidney disease, stage 2 (mild): Secondary | ICD-10-CM | POA: Diagnosis not present

## 2023-10-01 DIAGNOSIS — Z23 Encounter for immunization: Secondary | ICD-10-CM | POA: Diagnosis not present

## 2023-10-01 DIAGNOSIS — G8929 Other chronic pain: Secondary | ICD-10-CM | POA: Diagnosis not present

## 2023-10-01 DIAGNOSIS — M5440 Lumbago with sciatica, unspecified side: Secondary | ICD-10-CM | POA: Diagnosis not present

## 2023-10-01 DIAGNOSIS — R7303 Prediabetes: Secondary | ICD-10-CM | POA: Diagnosis not present

## 2023-10-01 DIAGNOSIS — E782 Mixed hyperlipidemia: Secondary | ICD-10-CM | POA: Diagnosis not present

## 2023-10-04 DIAGNOSIS — Z6827 Body mass index (BMI) 27.0-27.9, adult: Secondary | ICD-10-CM | POA: Diagnosis not present

## 2023-10-04 DIAGNOSIS — M48061 Spinal stenosis, lumbar region without neurogenic claudication: Secondary | ICD-10-CM | POA: Diagnosis not present

## 2023-10-21 DIAGNOSIS — M48061 Spinal stenosis, lumbar region without neurogenic claudication: Secondary | ICD-10-CM | POA: Diagnosis not present

## 2023-10-21 DIAGNOSIS — M545 Low back pain, unspecified: Secondary | ICD-10-CM | POA: Diagnosis not present

## 2023-11-04 DIAGNOSIS — M48061 Spinal stenosis, lumbar region without neurogenic claudication: Secondary | ICD-10-CM | POA: Diagnosis not present

## 2023-11-04 DIAGNOSIS — M545 Low back pain, unspecified: Secondary | ICD-10-CM | POA: Diagnosis not present

## 2023-11-11 DIAGNOSIS — Z6827 Body mass index (BMI) 27.0-27.9, adult: Secondary | ICD-10-CM | POA: Diagnosis not present

## 2023-11-11 DIAGNOSIS — M25551 Pain in right hip: Secondary | ICD-10-CM | POA: Diagnosis not present

## 2023-11-11 DIAGNOSIS — M48061 Spinal stenosis, lumbar region without neurogenic claudication: Secondary | ICD-10-CM | POA: Diagnosis not present

## 2023-11-27 DIAGNOSIS — M48061 Spinal stenosis, lumbar region without neurogenic claudication: Secondary | ICD-10-CM | POA: Diagnosis not present

## 2023-11-27 DIAGNOSIS — M5136 Other intervertebral disc degeneration, lumbar region with discogenic back pain only: Secondary | ICD-10-CM | POA: Diagnosis not present

## 2023-11-27 DIAGNOSIS — Z981 Arthrodesis status: Secondary | ICD-10-CM | POA: Diagnosis not present

## 2023-11-27 DIAGNOSIS — M5137 Other intervertebral disc degeneration, lumbosacral region with discogenic back pain only: Secondary | ICD-10-CM | POA: Diagnosis not present

## 2023-12-06 DIAGNOSIS — Z6827 Body mass index (BMI) 27.0-27.9, adult: Secondary | ICD-10-CM | POA: Diagnosis not present

## 2023-12-06 DIAGNOSIS — M48061 Spinal stenosis, lumbar region without neurogenic claudication: Secondary | ICD-10-CM | POA: Diagnosis not present

## 2024-01-02 DIAGNOSIS — L814 Other melanin hyperpigmentation: Secondary | ICD-10-CM | POA: Diagnosis not present

## 2024-01-02 DIAGNOSIS — L570 Actinic keratosis: Secondary | ICD-10-CM | POA: Diagnosis not present

## 2024-01-02 DIAGNOSIS — L2989 Other pruritus: Secondary | ICD-10-CM | POA: Diagnosis not present

## 2024-01-02 DIAGNOSIS — L821 Other seborrheic keratosis: Secondary | ICD-10-CM | POA: Diagnosis not present

## 2024-01-02 DIAGNOSIS — D225 Melanocytic nevi of trunk: Secondary | ICD-10-CM | POA: Diagnosis not present

## 2024-02-14 DIAGNOSIS — Z6827 Body mass index (BMI) 27.0-27.9, adult: Secondary | ICD-10-CM | POA: Diagnosis not present

## 2024-02-14 DIAGNOSIS — M48061 Spinal stenosis, lumbar region without neurogenic claudication: Secondary | ICD-10-CM | POA: Diagnosis not present

## 2024-02-17 DIAGNOSIS — K219 Gastro-esophageal reflux disease without esophagitis: Secondary | ICD-10-CM | POA: Diagnosis not present

## 2024-03-11 DIAGNOSIS — M25552 Pain in left hip: Secondary | ICD-10-CM | POA: Diagnosis not present

## 2024-03-11 DIAGNOSIS — M25551 Pain in right hip: Secondary | ICD-10-CM | POA: Diagnosis not present

## 2024-03-11 DIAGNOSIS — M545 Low back pain, unspecified: Secondary | ICD-10-CM | POA: Diagnosis not present

## 2024-03-26 DIAGNOSIS — Z125 Encounter for screening for malignant neoplasm of prostate: Secondary | ICD-10-CM | POA: Diagnosis not present

## 2024-03-26 DIAGNOSIS — I10 Essential (primary) hypertension: Secondary | ICD-10-CM | POA: Diagnosis not present

## 2024-03-26 DIAGNOSIS — E782 Mixed hyperlipidemia: Secondary | ICD-10-CM | POA: Diagnosis not present

## 2024-03-26 DIAGNOSIS — N182 Chronic kidney disease, stage 2 (mild): Secondary | ICD-10-CM | POA: Diagnosis not present

## 2024-03-26 DIAGNOSIS — F419 Anxiety disorder, unspecified: Secondary | ICD-10-CM | POA: Diagnosis not present

## 2024-03-26 DIAGNOSIS — M5440 Lumbago with sciatica, unspecified side: Secondary | ICD-10-CM | POA: Diagnosis not present

## 2024-03-26 DIAGNOSIS — R5383 Other fatigue: Secondary | ICD-10-CM | POA: Diagnosis not present

## 2024-03-26 DIAGNOSIS — R7303 Prediabetes: Secondary | ICD-10-CM | POA: Diagnosis not present

## 2024-03-26 DIAGNOSIS — G8929 Other chronic pain: Secondary | ICD-10-CM | POA: Diagnosis not present

## 2024-03-26 DIAGNOSIS — Z0000 Encounter for general adult medical examination without abnormal findings: Secondary | ICD-10-CM | POA: Diagnosis not present

## 2024-04-02 DIAGNOSIS — G8929 Other chronic pain: Secondary | ICD-10-CM | POA: Diagnosis not present

## 2024-04-02 DIAGNOSIS — M545 Low back pain, unspecified: Secondary | ICD-10-CM | POA: Diagnosis not present

## 2024-04-02 DIAGNOSIS — R252 Cramp and spasm: Secondary | ICD-10-CM | POA: Diagnosis not present

## 2024-04-02 DIAGNOSIS — I10 Essential (primary) hypertension: Secondary | ICD-10-CM | POA: Diagnosis not present

## 2024-04-02 DIAGNOSIS — R7303 Prediabetes: Secondary | ICD-10-CM | POA: Diagnosis not present

## 2024-04-02 DIAGNOSIS — M5440 Lumbago with sciatica, unspecified side: Secondary | ICD-10-CM | POA: Diagnosis not present

## 2024-04-02 DIAGNOSIS — F419 Anxiety disorder, unspecified: Secondary | ICD-10-CM | POA: Diagnosis not present

## 2024-04-02 DIAGNOSIS — E782 Mixed hyperlipidemia: Secondary | ICD-10-CM | POA: Diagnosis not present

## 2024-04-02 DIAGNOSIS — N182 Chronic kidney disease, stage 2 (mild): Secondary | ICD-10-CM | POA: Diagnosis not present

## 2024-04-02 DIAGNOSIS — Z0000 Encounter for general adult medical examination without abnormal findings: Secondary | ICD-10-CM | POA: Diagnosis not present

## 2024-04-08 DIAGNOSIS — M545 Low back pain, unspecified: Secondary | ICD-10-CM | POA: Diagnosis not present

## 2024-04-10 DIAGNOSIS — M545 Low back pain, unspecified: Secondary | ICD-10-CM | POA: Diagnosis not present

## 2024-04-15 DIAGNOSIS — M545 Low back pain, unspecified: Secondary | ICD-10-CM | POA: Diagnosis not present

## 2024-04-21 DIAGNOSIS — M545 Low back pain, unspecified: Secondary | ICD-10-CM | POA: Diagnosis not present

## 2024-04-29 DIAGNOSIS — M25511 Pain in right shoulder: Secondary | ICD-10-CM | POA: Diagnosis not present

## 2024-04-29 DIAGNOSIS — M461 Sacroiliitis, not elsewhere classified: Secondary | ICD-10-CM | POA: Diagnosis not present

## 2024-05-01 DIAGNOSIS — M545 Low back pain, unspecified: Secondary | ICD-10-CM | POA: Diagnosis not present

## 2024-06-03 DIAGNOSIS — I10 Essential (primary) hypertension: Secondary | ICD-10-CM | POA: Diagnosis not present

## 2024-06-10 DIAGNOSIS — G8929 Other chronic pain: Secondary | ICD-10-CM | POA: Diagnosis not present

## 2024-06-10 DIAGNOSIS — M461 Sacroiliitis, not elsewhere classified: Secondary | ICD-10-CM | POA: Diagnosis not present

## 2024-06-10 DIAGNOSIS — E782 Mixed hyperlipidemia: Secondary | ICD-10-CM | POA: Diagnosis not present

## 2024-06-10 DIAGNOSIS — I10 Essential (primary) hypertension: Secondary | ICD-10-CM | POA: Diagnosis not present

## 2024-06-10 DIAGNOSIS — N182 Chronic kidney disease, stage 2 (mild): Secondary | ICD-10-CM | POA: Diagnosis not present

## 2024-06-10 DIAGNOSIS — R7303 Prediabetes: Secondary | ICD-10-CM | POA: Diagnosis not present

## 2024-08-25 DIAGNOSIS — M25522 Pain in left elbow: Secondary | ICD-10-CM | POA: Diagnosis not present
